# Patient Record
Sex: Male | Born: 1980
Health system: Southern US, Community
[De-identification: ages and names within clinical notes are randomized; demographics above are authoritative.]

## PROBLEM LIST (undated history)

## (undated) DIAGNOSIS — K56609 Unspecified intestinal obstruction, unspecified as to partial versus complete obstruction: Secondary | ICD-10-CM

## (undated) HISTORY — PX: SMALL INTESTINE SURGERY: SHX150

---

## 2001-07-18 DIAGNOSIS — K56609 Unspecified intestinal obstruction, unspecified as to partial versus complete obstruction: Secondary | ICD-10-CM

## 2001-07-18 HISTORY — DX: Unspecified intestinal obstruction, unspecified as to partial versus complete obstruction: K56.609

## 2001-12-07 ENCOUNTER — Inpatient Hospital Stay (HOSPITAL_COMMUNITY): Admission: EM | Admit: 2001-12-07 | Discharge: 2001-12-07 | Payer: Self-pay | Admitting: *Deleted

## 2001-12-07 ENCOUNTER — Encounter: Payer: Self-pay | Admitting: Emergency Medicine

## 2009-04-02 ENCOUNTER — Encounter: Admission: RE | Admit: 2009-04-02 | Discharge: 2009-04-02 | Payer: Self-pay | Admitting: Internal Medicine

## 2010-12-03 NOTE — H&P (Signed)
[facility]  Patient:    HASHIM, EICHHORST Visit Number: [id number] MRN: [id number]          Service Type: MED Location: [phone] Attending Physician:  Arlis Porta Dictated by:   Adolph Pollack, M.D. Admit Date:  12/06/2001 Discharge Date: 12/07/2001                           History and Physical  CHIEF COMPLAINT:  Acute onset of periumbilical abdominal pain.  HISTORY OF PRESENT ILLNESS:  Mr. Tedesco is a 30 year old male in his normal state of health.  He came home from work yesterday afternoon and began having the acute onset of sharp periumbilical pain that radiated basically to the left lower quadrant region.  He subsequently tried to eat today, but each time he did it was associated with nausea and vomiting.  No fever or chills.  He had a bowel movement yesterday.  He states that he has had no change in his bowel habits recently.  No dysuria or hematuria.  When I saw him, he had attempted to take oral contrast for a CT scan and he had been vomiting.  He had been evaluated by the emergency department physician prior to this.  He states that he has not had anything else like this.  Because of the progressive and persistent pain, he presented to the emergency department for evaluation at approximately 9 p.m. on Dec 06, 2001.  PAST MEDICAL HISTORY:  Chronic hyperbilirubinemia.  PAST SURGICAL HISTORY:  None.  ALLERGIES:  None.  MEDICATIONS:  None.  SOCIAL HISTORY:  He denies tobacco or alcohol use.  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  [place] with his parents typically.  FAMILY HISTORY:  His mother has hypertension.  His has hyperbilirubinemia, etiology unknown.  REVIEW OF SYSTEMS:  Cardiovascular:  No heart disease or hypertension. Pulmonary:  No asthma, pneumonia, or TB.  GI:  No peptic ulcer disease, hepatitis, or  diverticulitis.  No melena or hematochezia.  GU:  No kidney stones.  Endocrine:  No diabetes or thyroid disease.  Neurologic:  No seizures.  Hematology:  No known bleeding disorders, blood clots, and transfusions.  PHYSICAL EXAMINATION:  Generally a well-developed, well-nourished male who does not appear to be in acute distress.  VITAL SIGNS:  His admission temperature was 98.2 degrees with a blood pressure of 96/60 and pulse of 114.  When he is placed in the sitting position, his blood pressure dropped to 70/36 with a pulse of 124 and a respiratory rate of 20.  SKIN:  Warm and dry.  No jaundice.  HEENT:  The eyes are icteric.  NECK:  Supple without masses.  CARDIOVASCULAR:  Increased rate with a regular rhythm.  RESPIRATORY:  Breath sounds equal and clear.  Respirations unlabored.  ABDOMEN:  Soft.  There is a left lower quadrant fullness that is tender to palpation.  Mild percussion tenderness in the left lower quadrant greater than the right lower quadrant, but no obvious peritoneal signs.  He has hypoactive bowel sounds.  No hernias noted.  RECTAL:  Exam was done by Smitty Cords. Cheek, M.D., but notable for some mild prostate tenderness and guaiac-negative stool.  EXTREMITIES:  Full range of motion.  No clubbing, cyanosis, or edema noted.  LABORATORY DATA:  Hemoglobin 17.5 with a white blood  cell count of 11,400 and a leftward shift.  His CMET is normal, except for a total bilirubin of 4.2. Lipase within normal limits.  The urinalysis is remarkable for elevation of ketones, 0-2 white blood cells, and a few bacteria.  A CT scan of the abdomen and pelvis was performed and read by Cristi Loron, M.D.  This demonstrated a dilated diverticular-like structure with inflammatory changes and a small amount of free fluid around it.  There is small bowel dilated proximal to this area with no free air.  Cristi Loron, M.D., felt that this was consistent with acute  Meckels diverticulitis.  IMPRESSION: 1. Abdominal pain with findings suggesting acute Meckels diverticulitis with    partial small bowel obstruction or ileus.  Currently no evidence of    peritonitis at this time. 2. Hypovolemia with orthostatic hypotension. 3. Chronic hyperbilirubinemia, etiology unknown.  PLAN:  Volume resuscitation and IV antibiotics.  I recommended that he undergo laparoscopy or laparotomy with possible bowel resection later today after volume resuscitation.  His mother would like to see if he can be transferred to the [place], [place] where they live as if he has a prolonged hospital stay it would be easier on them.  She also says that they have a surgeon that they have used before.  I told her that she would have to have a local physician and a local facility accept him in transfer and recommended that she call her local physician if this is what she wanted to do and I could arrange for a transfer.  I told her that I thought that this was something that needed to be taken care of relatively soon and should not wait for a day or two.  She seems to understand this. Dictated by:   Adolph Pollack, M.D. Attending Physician:  Arlis Porta DD:  12/07/01 TD:  12/09/01 Job: [id number]

## 2014-01-02 ENCOUNTER — Ambulatory Visit
Admission: RE | Admit: 2014-01-02 | Discharge: 2014-01-02 | Disposition: A | Discharge status: Home / Self Care | Payer: BC Managed Care – PPO | Source: Ambulatory Visit | Attending: Family | Admitting: Family

## 2014-01-02 ENCOUNTER — Other Ambulatory Visit: Payer: Self-pay | Admitting: Family

## 2014-01-02 DIAGNOSIS — R062 Wheezing: Secondary | ICD-10-CM

## 2014-01-02 DIAGNOSIS — J988 Other specified respiratory disorders: Secondary | ICD-10-CM

## 2015-12-29 DIAGNOSIS — H5213 Myopia, bilateral: Secondary | ICD-10-CM | POA: Diagnosis not present

## 2016-10-17 DIAGNOSIS — H5213 Myopia, bilateral: Secondary | ICD-10-CM | POA: Diagnosis not present

## 2017-07-28 DIAGNOSIS — J069 Acute upper respiratory infection, unspecified: Secondary | ICD-10-CM | POA: Diagnosis not present

## 2017-11-10 DIAGNOSIS — H5213 Myopia, bilateral: Secondary | ICD-10-CM | POA: Diagnosis not present

## 2018-01-09 ENCOUNTER — Emergency Department (HOSPITAL_COMMUNITY): Payer: BLUE CROSS/BLUE SHIELD

## 2018-01-09 ENCOUNTER — Observation Stay (HOSPITAL_COMMUNITY): Payer: BLUE CROSS/BLUE SHIELD | Admitting: Anesthesiology

## 2018-01-09 ENCOUNTER — Observation Stay (HOSPITAL_COMMUNITY)
Admission: EM | Admit: 2018-01-09 | Discharge: 2018-01-10 | Disposition: A | Discharge status: Home / Self Care | Payer: BLUE CROSS/BLUE SHIELD | Attending: Surgery | Admitting: Surgery

## 2018-01-09 ENCOUNTER — Other Ambulatory Visit: Payer: Self-pay

## 2018-01-09 ENCOUNTER — Encounter (HOSPITAL_COMMUNITY): Payer: Self-pay | Admitting: Emergency Medicine

## 2018-01-09 ENCOUNTER — Observation Stay (HOSPITAL_COMMUNITY): Payer: BLUE CROSS/BLUE SHIELD

## 2018-01-09 ENCOUNTER — Encounter (HOSPITAL_COMMUNITY)
Admission: EM | Disposition: A | Discharge status: Home / Self Care | Payer: Self-pay | Source: Home / Self Care | Attending: Emergency Medicine

## 2018-01-09 DIAGNOSIS — R079 Chest pain, unspecified: Secondary | ICD-10-CM | POA: Diagnosis not present

## 2018-01-09 DIAGNOSIS — K839 Disease of biliary tract, unspecified: Secondary | ICD-10-CM | POA: Diagnosis not present

## 2018-01-09 DIAGNOSIS — K8020 Calculus of gallbladder without cholecystitis without obstruction: Secondary | ICD-10-CM | POA: Diagnosis not present

## 2018-01-09 DIAGNOSIS — K8012 Calculus of gallbladder with acute and chronic cholecystitis without obstruction: Secondary | ICD-10-CM | POA: Diagnosis not present

## 2018-01-09 DIAGNOSIS — K8000 Calculus of gallbladder with acute cholecystitis without obstruction: Secondary | ICD-10-CM | POA: Diagnosis present

## 2018-01-09 DIAGNOSIS — Z6841 Body Mass Index (BMI) 40.0 and over, adult: Secondary | ICD-10-CM | POA: Insufficient documentation

## 2018-01-09 DIAGNOSIS — K8010 Calculus of gallbladder with chronic cholecystitis without obstruction: Secondary | ICD-10-CM | POA: Diagnosis not present

## 2018-01-09 DIAGNOSIS — K8050 Calculus of bile duct without cholangitis or cholecystitis without obstruction: Secondary | ICD-10-CM | POA: Diagnosis not present

## 2018-01-09 DIAGNOSIS — K828 Other specified diseases of gallbladder: Secondary | ICD-10-CM | POA: Diagnosis not present

## 2018-01-09 DIAGNOSIS — R109 Unspecified abdominal pain: Secondary | ICD-10-CM | POA: Diagnosis not present

## 2018-01-09 DIAGNOSIS — Z419 Encounter for procedure for purposes other than remedying health state, unspecified: Secondary | ICD-10-CM

## 2018-01-09 HISTORY — PX: CHOLECYSTECTOMY: SHX55

## 2018-01-09 HISTORY — DX: Unspecified intestinal obstruction, unspecified as to partial versus complete obstruction: K56.609

## 2018-01-09 LAB — URINALYSIS, ROUTINE W REFLEX MICROSCOPIC
Bilirubin Urine: NEGATIVE
Glucose, UA: NEGATIVE mg/dL
Hgb urine dipstick: NEGATIVE
KETONES UR: NEGATIVE mg/dL
LEUKOCYTES UA: NEGATIVE
NITRITE: NEGATIVE
PH: 6 (ref 5.0–8.0)
PROTEIN: NEGATIVE mg/dL
SPECIFIC GRAVITY, URINE: 1.024 (ref 1.005–1.030)

## 2018-01-09 LAB — COMPREHENSIVE METABOLIC PANEL
ALBUMIN: 4.5 g/dL (ref 3.5–5.0)
ALK PHOS: 72 U/L (ref 38–126)
ALT: 70 U/L — ABNORMAL HIGH (ref 0–44)
ANION GAP: 9 (ref 5–15)
AST: 32 U/L (ref 15–41)
BUN: 17 mg/dL (ref 6–20)
CALCIUM: 10 mg/dL (ref 8.9–10.3)
CO2: 27 mmol/L (ref 22–32)
Chloride: 105 mmol/L (ref 98–111)
Creatinine, Ser: 1.03 mg/dL (ref 0.61–1.24)
GFR calc Af Amer: >60 mL/min (ref >60)
GLUCOSE: 119 mg/dL — AB (ref 70–99)
Potassium: 3.9 mmol/L (ref 3.5–5.1)
Sodium: 141 mmol/L (ref 135–145)
Total Bilirubin: 2.2 mg/dL — ABNORMAL HIGH (ref 0.3–1.2)
Total Protein: 7.8 g/dL (ref 6.5–8.1)

## 2018-01-09 LAB — CBC WITH DIFFERENTIAL/PLATELET
Basophils Absolute: 0 10*3/uL (ref 0.0–0.1)
Basophils Relative: 0 %
EOS PCT: 0 %
Eosinophils Absolute: 0 10*3/uL (ref 0.0–0.7)
HCT: 46.6 % (ref 39.0–52.0)
Hemoglobin: 16.3 g/dL (ref 13.0–17.0)
LYMPHS PCT: 10 %
Lymphs Abs: 1.2 10*3/uL (ref 0.7–4.0)
MCH: 30.9 pg (ref 26.0–34.0)
MCHC: 35 g/dL (ref 30.0–36.0)
MCV: 88.3 fL (ref 78.0–100.0)
MONO ABS: 0.5 10*3/uL (ref 0.1–1.0)
Monocytes Relative: 4 %
Neutro Abs: 10 10*3/uL — ABNORMAL HIGH (ref 1.7–7.7)
Neutrophils Relative %: 86 %
PLATELETS: 252 10*3/uL (ref 150–400)
RBC: 5.28 MIL/uL (ref 4.22–5.81)
RDW: 12.4 % (ref 11.5–15.5)
WBC: 11.7 10*3/uL — AB (ref 4.0–10.5)

## 2018-01-09 LAB — I-STAT TROPONIN, ED
TROPONIN I, POC: 0.02 ng/mL (ref 0.00–0.08)
Troponin i, poc: 0.01 ng/mL (ref 0.00–0.08)

## 2018-01-09 LAB — LIPASE, BLOOD: Lipase: 29 U/L (ref 11–51)

## 2018-01-09 SURGERY — LAPAROSCOPIC CHOLECYSTECTOMY WITH INTRAOPERATIVE CHOLANGIOGRAM
Anesthesia: General | Site: Abdomen

## 2018-01-09 MED ORDER — SODIUM CHLORIDE 0.9 % IV SOLN
2.0000 g | Freq: Once | INTRAVENOUS | Status: AC
  Administered 2018-01-09: 2 g via INTRAVENOUS

## 2018-01-09 MED ORDER — FENTANYL CITRATE (PF) 250 MCG/5ML IJ SOLN
INTRAMUSCULAR | Status: DC | PRN
  Administered 2018-01-09: 50 ug via INTRAVENOUS
  Administered 2018-01-09: 150 ug via INTRAVENOUS

## 2018-01-09 MED ORDER — OXYCODONE HCL 5 MG PO TABS
5.0000 mg | ORAL_TABLET | ORAL | Status: DC | PRN
  Administered 2018-01-09: 5 mg via ORAL
  Administered 2018-01-10 (×2): 10 mg via ORAL
  Filled 2018-01-09: qty 2
  Filled 2018-01-09: qty 1
  Filled 2018-01-09: qty 2

## 2018-01-09 MED ORDER — PROPOFOL 10 MG/ML IV BOLUS
INTRAVENOUS | Status: DC | PRN
  Administered 2018-01-09: 200 mg via INTRAVENOUS
  Administered 2018-01-09 (×2): 50 mg via INTRAVENOUS

## 2018-01-09 MED ORDER — ONDANSETRON HCL 4 MG/2ML IJ SOLN: 4.0000 mg | Freq: Four times a day (QID) | INTRAMUSCULAR | Status: DC | PRN

## 2018-01-09 MED ORDER — OXYCODONE HCL 5 MG/5ML PO SOLN: 5.0000 mg | Freq: Once | ORAL | Status: DC | PRN

## 2018-01-09 MED ORDER — FENTANYL CITRATE (PF) 250 MCG/5ML IJ SOLN
INTRAMUSCULAR | Status: AC
  Filled 2018-01-09: qty 5

## 2018-01-09 MED ORDER — MIDAZOLAM HCL 2 MG/2ML IJ SOLN
INTRAMUSCULAR | Status: AC
  Filled 2018-01-09: qty 2

## 2018-01-09 MED ORDER — IOPAMIDOL (ISOVUE-300) INJECTION 61%
INTRAVENOUS | Status: AC
  Filled 2018-01-09: qty 50

## 2018-01-09 MED ORDER — ONDANSETRON HCL 4 MG/2ML IJ SOLN
INTRAMUSCULAR | Status: AC
  Filled 2018-01-09: qty 2

## 2018-01-09 MED ORDER — OXYCODONE HCL 5 MG PO TABS: 5.0000 mg | ORAL_TABLET | Freq: Once | ORAL | Status: DC | PRN

## 2018-01-09 MED ORDER — CEFAZOLIN SODIUM-DEXTROSE 2-4 GM/100ML-% IV SOLN
INTRAVENOUS | Status: AC
  Filled 2018-01-09: qty 100

## 2018-01-09 MED ORDER — SUGAMMADEX SODIUM 200 MG/2ML IV SOLN
INTRAVENOUS | Status: DC | PRN
  Administered 2018-01-09: 300 mg via INTRAVENOUS

## 2018-01-09 MED ORDER — SODIUM CHLORIDE 0.9 % IV SOLN
2.0000 g | INTRAVENOUS | Status: DC
  Filled 2018-01-09: qty 20

## 2018-01-09 MED ORDER — ONDANSETRON 4 MG PO TBDP: 4.0000 mg | ORAL_TABLET | Freq: Four times a day (QID) | ORAL | Status: DC | PRN

## 2018-01-09 MED ORDER — LACTATED RINGERS IV SOLN
INTRAVENOUS | Status: DC
  Administered 2018-01-09: 14:00:00 via INTRAVENOUS

## 2018-01-09 MED ORDER — LIDOCAINE 2% (20 MG/ML) 5 ML SYRINGE
INTRAMUSCULAR | Status: DC | PRN
  Administered 2018-01-09: 100 mg via INTRAVENOUS

## 2018-01-09 MED ORDER — LIDOCAINE 2% (20 MG/ML) 5 ML SYRINGE
INTRAMUSCULAR | Status: AC
  Filled 2018-01-09: qty 10

## 2018-01-09 MED ORDER — MIDAZOLAM HCL 2 MG/2ML IJ SOLN
INTRAMUSCULAR | Status: DC | PRN
  Administered 2018-01-09: 2 mg via INTRAVENOUS

## 2018-01-09 MED ORDER — IOPAMIDOL (ISOVUE-300) INJECTION 61%
INTRAVENOUS | Status: AC
  Filled 2018-01-09: qty 100

## 2018-01-09 MED ORDER — DOCUSATE SODIUM 100 MG PO CAPS: 100.0000 mg | ORAL_CAPSULE | Freq: Two times a day (BID) | ORAL | 0 refills | Status: AC

## 2018-01-09 MED ORDER — PROPOFOL 10 MG/ML IV BOLUS
INTRAVENOUS | Status: AC
  Filled 2018-01-09: qty 20

## 2018-01-09 MED ORDER — SUGAMMADEX SODIUM 500 MG/5ML IV SOLN
INTRAVENOUS | Status: AC
  Filled 2018-01-09: qty 5

## 2018-01-09 MED ORDER — LACTATED RINGERS IR SOLN
Status: DC | PRN
Start: 2018-01-09 — End: 2018-01-09
  Administered 2018-01-09: 1000 mL

## 2018-01-09 MED ORDER — ONDANSETRON HCL 4 MG/2ML IJ SOLN
4.0000 mg | INTRAMUSCULAR | Status: DC | PRN
  Administered 2018-01-09: 4 mg via INTRAVENOUS
  Filled 2018-01-09: qty 2

## 2018-01-09 MED ORDER — KETOROLAC TROMETHAMINE 30 MG/ML IJ SOLN: 30.0000 mg | Freq: Once | INTRAMUSCULAR | Status: DC | PRN

## 2018-01-09 MED ORDER — TRAMADOL HCL 50 MG PO TABS
50.0000 mg | ORAL_TABLET | Freq: Four times a day (QID) | ORAL | Status: DC | PRN
  Administered 2018-01-09: 50 mg via ORAL
  Filled 2018-01-09: qty 1

## 2018-01-09 MED ORDER — SODIUM CHLORIDE 0.9 % IV SOLN: INTRAVENOUS | Status: DC

## 2018-01-09 MED ORDER — DIPHENHYDRAMINE HCL 50 MG/ML IJ SOLN: 25.0000 mg | Freq: Four times a day (QID) | INTRAMUSCULAR | Status: DC | PRN

## 2018-01-09 MED ORDER — IOPAMIDOL (ISOVUE-300) INJECTION 61%
100.0000 mL | Freq: Once | INTRAVENOUS | Status: AC | PRN
  Administered 2018-01-09: 100 mL via INTRAVENOUS

## 2018-01-09 MED ORDER — GI COCKTAIL ~~LOC~~
30.0000 mL | Freq: Once | ORAL | Status: AC
  Administered 2018-01-09: 30 mL via ORAL
  Filled 2018-01-09: qty 30

## 2018-01-09 MED ORDER — SUCCINYLCHOLINE CHLORIDE 200 MG/10ML IV SOSY
PREFILLED_SYRINGE | INTRAVENOUS | Status: AC
  Filled 2018-01-09: qty 10

## 2018-01-09 MED ORDER — SODIUM CHLORIDE 0.9 % IV SOLN
INTRAVENOUS | Status: DC
  Administered 2018-01-09 – 2018-01-10 (×2): via INTRAVENOUS

## 2018-01-09 MED ORDER — PROMETHAZINE HCL 25 MG/ML IJ SOLN: 6.2500 mg | INTRAMUSCULAR | Status: DC | PRN

## 2018-01-09 MED ORDER — HEPARIN SODIUM (PORCINE) 5000 UNIT/ML IJ SOLN: 5000.0000 [IU] | Freq: Three times a day (TID) | INTRAMUSCULAR | Status: DC

## 2018-01-09 MED ORDER — HYDROMORPHONE HCL 1 MG/ML IJ SOLN
INTRAMUSCULAR | Status: AC
  Filled 2018-01-09: qty 1

## 2018-01-09 MED ORDER — MORPHINE SULFATE (PF) 2 MG/ML IV SOLN: 2.0000 mg | INTRAVENOUS | Status: DC | PRN

## 2018-01-09 MED ORDER — BUPIVACAINE-EPINEPHRINE 0.25% -1:200000 IJ SOLN
INTRAMUSCULAR | Status: DC | PRN
  Administered 2018-01-09: 26 mL

## 2018-01-09 MED ORDER — DEXAMETHASONE SODIUM PHOSPHATE 10 MG/ML IJ SOLN
INTRAMUSCULAR | Status: DC | PRN
  Administered 2018-01-09: 10 mg via INTRAVENOUS

## 2018-01-09 MED ORDER — DEXAMETHASONE SODIUM PHOSPHATE 10 MG/ML IJ SOLN
INTRAMUSCULAR | Status: AC
Start: 2018-01-09 — End: ?
  Filled 2018-01-09: qty 1

## 2018-01-09 MED ORDER — HYDROCODONE-ACETAMINOPHEN 5-325 MG PO TABS: 1.0000 | ORAL_TABLET | Freq: Four times a day (QID) | ORAL | 0 refills | Status: AC | PRN

## 2018-01-09 MED ORDER — 0.9 % SODIUM CHLORIDE (POUR BTL) OPTIME
TOPICAL | Status: DC | PRN
  Administered 2018-01-09: 1000 mL

## 2018-01-09 MED ORDER — IOPAMIDOL (ISOVUE-300) INJECTION 61%
INTRAVENOUS | Status: DC | PRN
  Administered 2018-01-09: 15 mL

## 2018-01-09 MED ORDER — BUPIVACAINE-EPINEPHRINE (PF) 0.25% -1:200000 IJ SOLN
INTRAMUSCULAR | Status: AC
  Filled 2018-01-09: qty 30

## 2018-01-09 MED ORDER — FAMOTIDINE 20 MG PO TABS
20.0000 mg | ORAL_TABLET | Freq: Every day | ORAL | Status: DC
  Administered 2018-01-09: 20 mg via ORAL
  Filled 2018-01-09: qty 1

## 2018-01-09 MED ORDER — FAMOTIDINE IN NACL 20-0.9 MG/50ML-% IV SOLN
20.0000 mg | Freq: Once | INTRAVENOUS | Status: AC
  Administered 2018-01-09: 20 mg via INTRAVENOUS
  Filled 2018-01-09: qty 50

## 2018-01-09 MED ORDER — ACETAMINOPHEN 500 MG PO TABS
1000.0000 mg | ORAL_TABLET | Freq: Four times a day (QID) | ORAL | Status: DC
  Administered 2018-01-09 – 2018-01-10 (×4): 1000 mg via ORAL
  Filled 2018-01-09 (×4): qty 2

## 2018-01-09 MED ORDER — ONDANSETRON HCL 4 MG/2ML IJ SOLN
INTRAMUSCULAR | Status: DC | PRN
  Administered 2018-01-09: 4 mg via INTRAVENOUS

## 2018-01-09 MED ORDER — DIPHENHYDRAMINE HCL 25 MG PO CAPS: 25.0000 mg | ORAL_CAPSULE | Freq: Four times a day (QID) | ORAL | Status: DC | PRN

## 2018-01-09 MED ORDER — HYDROMORPHONE HCL 1 MG/ML IJ SOLN
0.2500 mg | INTRAMUSCULAR | Status: DC | PRN
  Administered 2018-01-09: 0.5 mg via INTRAVENOUS

## 2018-01-09 MED ORDER — ROCURONIUM BROMIDE 10 MG/ML (PF) SYRINGE
PREFILLED_SYRINGE | INTRAVENOUS | Status: DC | PRN
  Administered 2018-01-09: 10 mg via INTRAVENOUS
  Administered 2018-01-09: 70 mg via INTRAVENOUS

## 2018-01-09 MED ORDER — MORPHINE SULFATE (PF) 4 MG/ML IV SOLN
4.0000 mg | INTRAVENOUS | Status: DC | PRN
  Administered 2018-01-09: 4 mg via INTRAVENOUS
  Filled 2018-01-09: qty 1

## 2018-01-09 SURGICAL SUPPLY — 38 items
ADH SKN CLS APL DERMABOND .7 (GAUZE/BANDAGES/DRESSINGS) ×1
APL SKNCLS STERI-STRIP NONHPOA (GAUZE/BANDAGES/DRESSINGS) ×1
APPLIER CLIP ROT 10 11.4 M/L (STAPLE) ×2
APR CLP MED LRG 11.4X10 (STAPLE) ×1
BAG SPEC RTRVL LRG 6X4 10 (ENDOMECHANICALS) ×1
BENZOIN TINCTURE PRP APPL 2/3 (GAUZE/BANDAGES/DRESSINGS) ×1 IMPLANT
CABLE HIGH FREQUENCY MONO STRZ (ELECTRODE) ×2 IMPLANT
CHLORAPREP W/TINT 26ML (MISCELLANEOUS) ×2 IMPLANT
CLIP APPLIE ROT 10 11.4 M/L (STAPLE) ×1 IMPLANT
COVER MAYO STAND STRL (DRAPES) ×1 IMPLANT
COVER SURGICAL LIGHT HANDLE (MISCELLANEOUS) ×2 IMPLANT
DECANTER SPIKE VIAL GLASS SM (MISCELLANEOUS) ×2 IMPLANT
DERMABOND ADVANCED (GAUZE/BANDAGES/DRESSINGS) ×1
DERMABOND ADVANCED .7 DNX12 (GAUZE/BANDAGES/DRESSINGS) ×1 IMPLANT
DRAPE C-ARM 42X120 X-RAY (DRAPES) ×1 IMPLANT
ELECT REM PT RETURN 15FT ADLT (MISCELLANEOUS) ×2 IMPLANT
GLOVE BIO SURGEON STRL SZ 6 (GLOVE) ×2 IMPLANT
GLOVE INDICATOR 6.5 STRL GRN (GLOVE) ×2 IMPLANT
GOWN STRL REUS W/TWL LRG LVL3 (GOWN DISPOSABLE) ×2 IMPLANT
GOWN STRL REUS W/TWL XL LVL3 (GOWN DISPOSABLE) ×4 IMPLANT
GRASPER SUT TROCAR 14GX15 (MISCELLANEOUS) IMPLANT
HEMOSTAT SNOW SURGICEL 2X4 (HEMOSTASIS) IMPLANT
KIT BASIN OR (CUSTOM PROCEDURE TRAY) ×2 IMPLANT
NDL INSUFFLATION 14GA 120MM (NEEDLE) ×1 IMPLANT
NEEDLE INSUFFLATION 14GA 120MM (NEEDLE) ×2 IMPLANT
POUCH SPECIMEN RETRIEVAL 10MM (ENDOMECHANICALS) ×2 IMPLANT
SCISSORS LAP 5X35 DISP (ENDOMECHANICALS) ×2 IMPLANT
SET CHOLANGIOGRAPH MIX (MISCELLANEOUS) ×1 IMPLANT
SET IRRIG TUBING LAPAROSCOPIC (IRRIGATION / IRRIGATOR) ×2 IMPLANT
SLEEVE XCEL OPT CAN 5 100 (ENDOMECHANICALS) ×4 IMPLANT
STRIP CLOSURE SKIN 1/2X4 (GAUZE/BANDAGES/DRESSINGS) ×1 IMPLANT
SUT MNCRL AB 4-0 PS2 18 (SUTURE) ×2 IMPLANT
TOWEL OR 17X26 10 PK STRL BLUE (TOWEL DISPOSABLE) ×2 IMPLANT
TOWEL OR NON WOVEN STRL DISP B (DISPOSABLE) IMPLANT
TRAY LAPAROSCOPIC (CUSTOM PROCEDURE TRAY) ×2 IMPLANT
TROCAR BLADELESS OPT 5 100 (ENDOMECHANICALS) ×2 IMPLANT
TROCAR XCEL 12X100 BLDLESS (ENDOMECHANICALS) ×2 IMPLANT
TUBING INSUF HEATED (TUBING) ×2 IMPLANT

## 2018-01-09 NOTE — H&P (Addendum)
Steven Webb is an 37 y.o. male.   Chief Complaint: Abdominal pain PCP: [facility] [place]  HPI: Patient is a 37 year old male who started abdominal pain last evening went up to his chest.  He took Gas-X and Tums without relief.  He presented to the ED this a.m. with assistant constant upper abdominal pain described as sharp with radiation to his chest.  Complains of pain in his right side and could not touch it due to increased abdominal pain.  No nausea vomiting diarrhea no fevers no back pain no rashes no chest pain or shortness of breath.  Work-up in the ED shows his blood pressure is elevated at 174/103.  It remains high the last one at 10 AM was 155/100.  Respiratory rate and pulses are normal.  Sats are 92% on room air.  His BMI is 40.89. Labs shows electrolytes are normal, his glucose is 119.  His lipase is 32.  His bilirubin is elevated at 2.2.  AST is 32, ALT is 70.  White count is 11.7, hemoglobin 16.3, hematocrit 46.6, platelets are 252,000.  His left shift with 86 neutrophils.  Urinalysis is negative. Chest x-ray is normal there is mild cardiomegaly with no acute active cardiopulmonary disease.  CT of the abdomen shows steatosis of the liver possible stones in the gallbladder neck.  The gallbladder somewhat distended there is a clinical concern regarding cholecystitis.  An abdominal ultrasound was obtained at that time. This shows mild sludge and tiny gallstones noted.  Gallbladder wall thickness is 2.5 mm.  The bladder was distended.  Common bile duct was 5.7 mm.  Liver showed increased hepatic echogenicity consistent with fatty liver infiltration or hepatocellular disease.  Portal vein was patent with normal blood flow.  We are asked to see.   Past Medical History:  Diagnosis Date  . SBO (small bowel obstruction) ([place]) 2003    History reviewed. No pertinent surgical history.  No family history on file. Social History:  has no tobacco, alcohol, and drug  history on file. History of Meckel's diverticulum with resection 2002 Alcohol social Drugs: None Tobacco: None He is a Forensic psychologist.,  Married Allergies: No Known Allergies  Prior to Admission medications   Medication Sig Start Date End Date Taking? Authorizing Provider  Multiple Vitamins-Minerals (MULTIVITAMIN ADULT PO) Take 1 tablet by mouth daily.   Yes [provider]      Results for orders placed or performed during the hospital encounter of 01/09/18 (from the past 48 hour(s))  CBC with Differential     Status: Abnormal   Collection Time: 01/09/18  8:28 AM  Result Value Ref Range   WBC 11.7 (H) 4.0 - 10.5 K/uL   RBC 5.28 4.22 - 5.81 MIL/uL   Hemoglobin 16.3 13.0 - 17.0 g/dL   HCT 46.6 39.0 - 52.0 %   MCV 88.3 78.0 - 100.0 fL   MCH 30.9 26.0 - 34.0 pg   MCHC 35.0 30.0 - 36.0 g/dL   RDW 12.4 11.5 - 15.5 %   Platelets 252 150 - 400 K/uL   Neutrophils Relative % 86 %   Neutro Abs 10.0 (H) 1.7 - 7.7 K/uL   Lymphocytes Relative 10 %   Lymphs Abs 1.2 0.7 - 4.0 K/uL   Monocytes Relative 4 %   Monocytes Absolute 0.5 0.1 - 1.0 K/uL   Eosinophils Relative 0 %   Eosinophils Absolute 0.0 0.0 - 0.7 K/uL   Basophils Relative 0 %   Basophils Absolute  0.0 0.0 - 0.1 K/uL    Comment: Performed at [facility], [place] [address] [place], [place] [address]  Comprehensive metabolic panel     Status: Abnormal   Collection Time: 01/09/18  8:28 AM  Result Value Ref Range   Sodium 141 135 - 145 mmol/L   Potassium 3.9 3.5 - 5.1 mmol/L   Chloride 105 98 - 111 mmol/L    Comment: Please note change in reference range.   CO2 27 22 - 32 mmol/L   Glucose, Bld 119 (H) 70 - 99 mg/dL    Comment: Please note change in reference range.   BUN 17 6 - 20 mg/dL    Comment: Please note change in reference range.   Creatinine, Ser 1.03 0.61 - 1.24 mg/dL   Calcium 10.0 8.9 - 10.3 mg/dL   Total Protein 7.8 6.5 - 8.1 g/dL   Albumin 4.5 3.5 - 5.0 g/dL   AST 32 15 - 41 U/L    ALT 70 (H) 0 - 44 U/L    Comment: Please note change in reference range.   Alkaline Phosphatase 72 38 - 126 U/L   Total Bilirubin 2.2 (H) 0.3 - 1.2 mg/dL   GFR calc non Af Amer >60 >60 mL/min   GFR calc Af Amer >60 >60 mL/min    Comment: (NOTE) The eGFR has been calculated using the CKD EPI equation. This calculation has not been validated in all clinical situations. eGFR's persistently <60 mL/min signify possible Chronic Kidney Disease.    Anion gap 9 5 - 15    Comment: Performed at [facility], [place] [address] [place], [place] [address]  Lipase, blood     Status: None   Collection Time: 01/09/18  8:28 AM  Result Value Ref Range   Lipase 29 11 - 51 U/L    Comment: Performed at [facility], [place] [address] [place], [place] [address]  Urinalysis, Routine w reflex microscopic     Status: None   Collection Time: 01/09/18  8:28 AM  Result Value Ref Range   Color, Urine YELLOW YELLOW   APPearance CLEAR CLEAR   Specific Gravity, Urine 1.024 1.005 - 1.030   pH 6.0 5.0 - 8.0   Glucose, UA NEGATIVE NEGATIVE mg/dL   Hgb urine dipstick NEGATIVE NEGATIVE   Bilirubin Urine NEGATIVE NEGATIVE   Ketones, ur NEGATIVE NEGATIVE mg/dL   Protein, ur NEGATIVE NEGATIVE mg/dL   Nitrite NEGATIVE NEGATIVE   Leukocytes, UA NEGATIVE NEGATIVE    Comment: Performed at [facility] [address] [place], [place] [address]  I-Stat Troponin, ED (not at [facility])     Status: None   Collection Time: 01/09/18  8:47 AM  Result Value Ref Range   Troponin i, poc 0.01 0.00 - 0.08 ng/mL   Comment 3            Comment: Due to the release kinetics of cTnI, a negative result within the first hours of the onset of symptoms does not rule out myocardial infarction with certainty. If myocardial infarction is still suspected, repeat the test at appropriate intervals.   I-stat troponin, ED     Status: None   Collection Time: 01/09/18 10:57 AM  Result Value  Ref Range   Troponin i, poc 0.02 0.00 - 0.08 ng/mL   Comment 3            Comment: Due to the release kinetics of cTnI, a negative result within the first  hours of the onset of symptoms does not rule out myocardial infarction with certainty. If myocardial infarction is still suspected, repeat the test at appropriate intervals.    Dg Chest 2 View  Result Date: 01/09/2018 CLINICAL DATA:  New onset mid chest pain. EXAM: CHEST - 2 VIEW COMPARISON:  Chest x-ray dated January 02, 2014. FINDINGS: Mild cardiomegaly, unchanged. Normal pulmonary vascularity. No focal consolidation, pleural effusion, or pneumothorax. No acute osseous abnormality. IMPRESSION: No active cardiopulmonary disease. Electronically Signed   By: Titus Dubin M.D.   On: 01/09/2018 07:59   Ct Abdomen Pelvis W Contrast  Result Date: 01/09/2018 CLINICAL DATA:  Abdominal pain beginning last night, progressing to chest pain. History of colon resection. EXAM: CT ABDOMEN AND PELVIS WITH CONTRAST TECHNIQUE: Multidetector CT imaging of the abdomen and pelvis was performed using the standard protocol following bolus administration of intravenous contrast. CONTRAST:  150m ISOVUE-300 IOPAMIDOL (ISOVUE-300) INJECTION 61% COMPARISON:  None. FINDINGS: Lower chest: No significant finding. Hepatobiliary: Diffuse fatty change of the liver without focal lesion. I suspect that there are some pigment stones in the gallbladder neck region. The gallbladder is distended. If there is clinical concern about the possibility of cholecystitis, ultrasound would be suggested. Pancreas: Normal Spleen: Normal Adrenals/Urinary Tract: Adrenal glands are normal. Kidneys are normal. Bladder is normal. Stomach/Bowel: No evidence of bowel obstruction or inflammation. Vascular/Lymphatic: Normal Reproductive: Normal Other: No free fluid or air. Musculoskeletal: Negative IMPRESSION: Steatosis of the liver. Suspicion of pigment stones in the gallbladder neck. Gallbladder  somewhat distended. Is there clinical concern regarding cholecystitis? If so, ultrasound would be suggested. Electronically Signed   By: MNelson ChimesM.D.   On: 01/09/2018 10:08   UKoreaAbdomen Limited  Result Date: 01/09/2018 CLINICAL DATA:  Questionable gallstones. EXAM: ULTRASOUND ABDOMEN LIMITED RIGHT UPPER QUADRANT COMPARISON:  CT 01/09/2018. FINDINGS: Gallbladder: Mild sludge and tiny gallstones noted. Gallbladder wall thickness 2.5 mm. Gallbladder is distended. Negative Murphy sign. Common bile duct: Diameter: 5.7 mm. Liver: Increased hepatic echogenicity consistent fatty infiltration and or hepatocellular disease. Focal area of decreased echogenicity adjacent to the gallbladder fossa consistent with focal fatty sparing. Portal vein is patent on color Doppler imaging with normal direction of blood flow towards the liver. IMPRESSION: 1. Mild sludge with tiny gallstones noted. Gallbladder is distended. Negative Murphy sign. No biliary distention. 2. Increased hepatic echogenicity consistent with fatty infiltration and/or hepatocellular disease. Focal area of fatty sparing appears to be present adjacent to the gallbladder fossa. Electronically Signed   By: TMarcello Moores Register   On: 01/09/2018 11:44    Review of Systems  Constitutional: Negative.   HENT: Negative.   Eyes: Negative.   Respiratory: Negative.   Cardiovascular: Negative.   Gastrointestinal: Positive for abdominal pain, blood in stool and heartburn. Negative for constipation and diarrhea.  Genitourinary: Negative.   Musculoskeletal: Negative.   Skin: Negative.   Neurological: Negative.   Endo/Heme/Allergies: Negative.   Psychiatric/Behavioral: Negative.     Blood pressure (!) 155/100, pulse 64, temperature 98.6 F (37 C), temperature source Oral, resp. rate 15, height '5\' 10"'$  (1.778 m), weight 129.3 kg (285 lb), SpO2 97 %. Physical Exam  Constitutional: He is oriented to person, place, and time. He appears well-developed and  well-nourished. No distress.  HENT:  Head: Normocephalic and atraumatic.  Mouth/Throat: Oropharynx is clear and moist.  Eyes: Right eye exhibits no discharge. Left eye exhibits no discharge. No scleral icterus.  Pupils are equal  Neck: Normal range of motion. Neck supple. No JVD present. No tracheal  deviation present. No thyromegaly present.  Cardiovascular: Normal rate, regular rhythm, normal heart sounds and intact distal pulses.  No murmur heard. Respiratory: Effort normal and breath sounds normal. No respiratory distress. He has no wheezes. He has no rales. He exhibits no tenderness.  GI: Soft. Bowel sounds are normal. He exhibits no distension and no mass. There is tenderness (RUQ). There is no rebound and no guarding.  Right lower quadrant scar from Meckel's diverticulum.  Musculoskeletal: He exhibits no edema or tenderness.  Lymphadenopathy:    He has no cervical adenopathy.  Neurological: He is alert and oriented to person, place, and time. No cranial nerve deficit.  Skin: Skin is warm and dry. No rash noted. He is not diaphoretic. No erythema. No pallor.  Psychiatric: He has a normal mood and affect. His behavior is normal. Judgment and thought content normal.     Assessment/Plan Cholelithiasis/cholecystitis Body mass index is 40.89   Plan: She is being admitted and taken to the operating room for cholecystectomy and intraoperative cholangiogram.  Patient was seen and evaluated by Dr. Windle Guard. the risk and benefits were discussed in detail.  Questions were answered and patient is agreeable to the procedure. Initiating antibiotic therapy and surgery this afternoon. Jahden Schara, PA-C 01/09/2018, 12:27 PM

## 2018-01-09 NOTE — Op Note (Signed)
Operative Note  Steven Webb 37 y.o. male [id number]  01/09/2018  Surgeon: Berna Buehelsea A Brylen Wagar MD  Assistant: Will Marlyne BeardsJennings PA-C  Procedure performed: Laparoscopic Cholecystectomy with intraoperative cholangiogram  Preop diagnosis: cholecystitis Post-op diagnosis/intraop findings: acute on chronic cholecystitis with large stone impacted in the gallbladder neck, negative cholangiogram  Specimens: gallbladder  EBL: minimal  Complications: none  Description of procedure: After obtaining informed consent the patient was brought to the operating room. Prophylactic antibiotics were administered. SCD's were applied. General endotracheal anesthesia was initiated and a formal time-out was performed. The abdomen was prepped and draped in the usual sterile fashion and the abdomen was entered using visiport technique in the left upper quadrant after instilling the site with local. Insufflation to 15mmHg was obtained and gross inspection revealed no evidence of injury from our entry or other intraabdominal abnormalities. Three 5mm trocars were introduced in the supraumbilical, right midclavicular and right anterior axillary lines under direct visualization and following infiltration with local. An 11mm trocar was placed in the epigastrium. the gallbladder was very large and distended. It was decompressed with the Nez[place] aspirator so that it could be grasped. There were extensive omental adhesions with significant edema to the body and infundibulum of the gallbladder which were carefully taken down with cautery and blunt dissection until the infundibulum could be clearly identified. The gallbladder was retracted cephalad and the infundibulum was retracted laterally. A combination of hook electrocautery and blunt dissection was utilized to clear the peritoneum from the neck and cystic duct, circumferentially isolating the cystic artery and cystic duct and lifting the gallbladder from the cystic plate. The  critical view of safety was achieved with the cystic artery, cystic duct, and liver bed visualized between them with no other structures. The artery was clipped with a single clip proximally and distally and divided. A clip was placed on the distal cystic duct and a ductotomy made sharply. The [facility] catheter was introduced through a stab incision in the upper abdomen and then inserted into the cystic duct where it was secured with a clip. A cholangiogram was performed which demonstrated appropriate biliary anatomy with passage of contrast easily into the duodenum with no filling defects on my review. The catheter was removed along with its securing clip and 3 clips were placed on the proximal cystic duct which was then divided sharply. The gallbladder was dissected from the liver plate using electrocautery. Once freed the gallbladder was placed in an endocatch bag and removed intact through the epigastric trocar site which did have to be extended in order to extract the large gallbladder containing large stones. A small amount of bleeding on the liver bed was controlled with cautery. Some bile had been spilled from the gallbladder from the point where the Nezhat aspirator had been inserted. This was aspirated and the right upper quadrant was irrigated copiously until the effluent was clear. Hemostasis was once again confirmed, and reinspection of the abdomen revealed no injuries. Given his history of appendectomy and Meckel's diverticulectomy, the lower abdomen was also inspected and was noted to be free of any adhesions or gross abnormality. The clips were well opposed without any bile leak from the duct or the liver bed. The 11mm trocar site in the epigastrium was closed with two 0 vicryls in the fascia under direct visualization using a PMI device. The abdomen was desufflated and all trocars removed. The skin incisions were closed with running subcuticular monocryl. Benzoin, Steri-Strips and Band-Aids were  applied. The patient was awakened, extubated and  transported to the recovery room in stable condition.   All counts were correct at the completion of the case.

## 2018-01-09 NOTE — Anesthesia Preprocedure Evaluation (Signed)
Anesthesia Evaluation  Patient identified by MRN, date of birth, ID band Patient awake    Reviewed: Allergy & Precautions, NPO status , Patient's Chart, lab work & pertinent test results  Airway Mallampati: II  TM Distance: >3 FB Neck ROM: Full    Dental no notable dental hx.    Pulmonary neg pulmonary ROS,    Pulmonary exam normal breath sounds clear to auscultation       Cardiovascular negative cardio ROS Normal cardiovascular exam Rhythm:Regular Rate:Normal     Neuro/Psych negative neurological ROS  negative psych ROS   GI/Hepatic negative GI ROS, Neg liver ROS,   Endo/Other  Morbid obesity  Renal/GU negative Renal ROS  negative genitourinary   Musculoskeletal negative musculoskeletal ROS (+)   Abdominal   Peds negative pediatric ROS (+)  Hematology negative hematology ROS (+)   Anesthesia Other Findings   Reproductive/Obstetrics negative OB ROS                            Anesthesia Physical Anesthesia Plan  ASA: II  Anesthesia Plan: General   Post-op Pain Management:    Induction: Intravenous  PONV Risk Score and Plan: 2 and Ondansetron, Dexamethasone and Treatment may vary due to age or medical condition  Airway Management Planned: Oral ETT  Additional Equipment:   Intra-op Plan:   Post-operative Plan: Extubation in OR  Informed Consent: I have reviewed the patients History and Physical, chart, labs and discussed the procedure including the risks, benefits and alternatives for the proposed anesthesia with the patient or authorized representative who has indicated his/her understanding and acceptance.   Dental advisory given  Plan Discussed with: CRNA and Surgeon  Anesthesia Plan Comments:         Anesthesia Quick Evaluation  

## 2018-01-09 NOTE — ED Triage Notes (Signed)
Pt presenting with abdominal pain that began around midnight that then progressed to chest pain. Pt reports taking GasX and Tums with no relief.

## 2018-01-09 NOTE — ED Provider Notes (Signed)
[facility]-EMERGENCY DEPT Provider Note   CSN: [id number] Arrival date & time: 01/09/18  0541     History   Chief Complaint Chief Complaint  Patient presents with  . Chest Pain  . Abdominal Pain    HPI Steven Webb is a 37 y.o. male.  HPI Pt was seen at 0715.  Per pt, c/o gradual onset and persistence of constant upper abd "pain" since approximately midnight.  Describes the abd pain as "sharp" with radiation into his anterior chest. States he could not lay on his right side due to increasing pain in the right side of his abd.  Denies N/V, no diarrhea, no fevers, no back pain, no rash, no CP/SOB, no black or blood in stools.      Past Medical History:  Diagnosis Date  . SBO (small bowel obstruction) (HCC) 2003    There are no active problems to display for this patient.   History reviewed. No pertinent surgical history.      Home Medications    Prior to Admission medications   Not on File    Family History No family history on file.  Social History Social History   Tobacco Use  . Smoking status: Not on file  Substance Use Topics  . Alcohol use: Not on file  . Drug use: Not on file     Allergies   Patient has no known allergies.   Review of Systems Review of Systems ROS: Statement: All systems negative except as marked or noted in the HPI; Constitutional: Negative for fever and chills. ; ; Eyes: Negative for eye pain, redness and discharge. ; ; ENMT: Negative for ear pain, hoarseness, nasal congestion, sinus pressure and sore throat. ; ; Cardiovascular: Negative for chest pain, palpitations, diaphoresis, dyspnea and peripheral edema. ; ; Respiratory: Negative for cough, wheezing and stridor. ; ; Gastrointestinal: +abd pain. Negative for nausea, vomiting, diarrhea, blood in stool, hematemesis, jaundice and rectal bleeding. . ; ; Genitourinary: Negative for dysuria, flank pain and hematuria. ; ; Musculoskeletal: Negative for back pain  and neck pain. Negative for swelling and trauma.; ; Skin: Negative for pruritus, rash, abrasions, blisters, bruising and skin lesion.; ; Neuro: Negative for headache, lightheadedness and neck stiffness. Negative for weakness, altered level of consciousness, altered mental status, extremity weakness, paresthesias, involuntary movement, seizure and syncope.       Physical Exam Updated Vital Signs BP (!) 174/103 (BP Location: Left Arm)   Pulse (!) 59   Temp 98.6 F (37 C) (Oral)   Resp 14   Ht 5\' 10"  (1.778 m)   Wt 129.3 kg (285 lb)   SpO2 100%   BMI 40.89 kg/m    BP (!) 166/92   Pulse 66   Temp 98.6 F (37 C) (Oral)   Resp 10   Ht 5\' 10"  (1.778 m)   Wt 129.3 kg (285 lb)   SpO2 97%   BMI 40.89 kg/m    Physical Exam 0720: Physical examination:  Nursing notes reviewed; Vital signs and O2 SAT reviewed;  Constitutional: Well developed, Well nourished, Well hydrated, In no acute distress; Head:  Normocephalic, atraumatic; Eyes: EOMI, PERRL, No scleral icterus; ENMT: Mouth and pharynx normal, Mucous membranes moist; Neck: Supple, Full range of motion, No lymphadenopathy; Cardiovascular: Regular rate and rhythm, No gallop; Respiratory: Breath sounds clear & equal bilaterally, No wheezes.  Speaking full sentences with ease, Normal respiratory effort/excursion; Chest: Nontender, Movement normal; Abdomen: Soft, +upper abd > diffuse tenderness to palp. No rebound  or guarding. Nondistended, Normal bowel sounds; Genitourinary: No CVA tenderness; Extremities: Peripheral pulses normal, No tenderness, No edema, No calf edema or asymmetry.; Neuro: AA&Ox3, Major CN grossly intact.  Speech clear. No gross focal motor or sensory deficits in extremities.; Skin: Color normal, Warm, Dry.   ED Treatments / Results  Labs (all labs ordered are listed, but only abnormal results are displayed)   EKG EKG Interpretation  Date/Time:  Tuesday January 09 2018 05:50:54 EDT Ventricular Rate:  62 PR Interval:      QRS Duration: 95 QT Interval:  402 QTC Calculation: 409 R Axis:   69 Text Interpretation:  Sinus rhythm Normal ECG No previous ECGs available Confirmed by Paula Libra ([id number]) on 01/09/2018 5:56:16 AM   Radiology   Procedures Procedures (including critical care time)  Medications Ordered in ED Medications  morphine 4 MG/ML injection 4 mg (has no administration in time range)  ondansetron (ZOFRAN) injection 4 mg (has no administration in time range)  famotidine (PEPCID) IVPB 20 mg premix (has no administration in time range)  gi cocktail (Maalox,Lidocaine,Donnatal) (30 mLs Oral Given 01/09/18 6045)     Initial Impression / Assessment and Plan / ED Course  I have reviewed the triage vital signs and the nursing notes.  Pertinent labs & imaging results that were available during my care of the patient were reviewed by me and considered in my medical decision making (see chart for details).  MDM Reviewed: previous chart, nursing note and vitals Reviewed previous: labs Interpretation: labs, ECG, x-ray and CT scan   Results for orders placed or performed during the hospital encounter of 01/09/18  CBC with Differential  Result Value Ref Range   WBC 11.7 (H) 4.0 - 10.5 K/uL   RBC 5.28 4.22 - 5.81 MIL/uL   Hemoglobin 16.3 13.0 - 17.0 g/dL   HCT 40.9 81.1 - 91.4 %   MCV 88.3 78.0 - 100.0 fL   MCH 30.9 26.0 - 34.0 pg   MCHC 35.0 30.0 - 36.0 g/dL   RDW 78.2 95.6 - 21.3 %   Platelets 252 150 - 400 K/uL   Neutrophils Relative % 86 %   Neutro Abs 10.0 (H) 1.7 - 7.7 K/uL   Lymphocytes Relative 10 %   Lymphs Abs 1.2 0.7 - 4.0 K/uL   Monocytes Relative 4 %   Monocytes Absolute 0.5 0.1 - 1.0 K/uL   Eosinophils Relative 0 %   Eosinophils Absolute 0.0 0.0 - 0.7 K/uL   Basophils Relative 0 %   Basophils Absolute 0.0 0.0 - 0.1 K/uL  Comprehensive metabolic panel  Result Value Ref Range   Sodium 141 135 - 145 mmol/L   Potassium 3.9 3.5 - 5.1 mmol/L   Chloride 105 98 - 111 mmol/L    CO2 27 22 - 32 mmol/L   Glucose, Bld 119 (H) 70 - 99 mg/dL   BUN 17 6 - 20 mg/dL   Creatinine, Ser 0.86 0.61 - 1.24 mg/dL   Calcium 57.8 8.9 - 46.9 mg/dL   Total Protein 7.8 6.5 - 8.1 g/dL   Albumin 4.5 3.5 - 5.0 g/dL   AST 32 15 - 41 U/L   ALT 70 (H) 0 - 44 U/L   Alkaline Phosphatase 72 38 - 126 U/L   Total Bilirubin 2.2 (H) 0.3 - 1.2 mg/dL   GFR calc non Af Amer >60 >60 mL/min   GFR calc Af Amer >60 >60 mL/min   Anion gap 9 5 - 15  Lipase, blood  Result Value  Ref Range   Lipase 29 11 - 51 U/L  Urinalysis, Routine w reflex microscopic  Result Value Ref Range   Color, Urine YELLOW YELLOW   APPearance CLEAR CLEAR   Specific Gravity, Urine 1.024 1.005 - 1.030   pH 6.0 5.0 - 8.0   Glucose, UA NEGATIVE NEGATIVE mg/dL   Hgb urine dipstick NEGATIVE NEGATIVE   Bilirubin Urine NEGATIVE NEGATIVE   Ketones, ur NEGATIVE NEGATIVE mg/dL   Protein, ur NEGATIVE NEGATIVE mg/dL   Nitrite NEGATIVE NEGATIVE   Leukocytes, UA NEGATIVE NEGATIVE  I-Stat Troponin, ED (not at [facility])  Result Value Ref Range   Troponin i, poc 0.01 0.00 - 0.08 ng/mL   Comment 3          I-stat troponin, ED  Result Value Ref Range   Troponin i, poc 0.02 0.00 - 0.08 ng/mL   Comment 3           Dg Chest 2 View Result Date: 01/09/2018 CLINICAL DATA:  New onset mid chest pain. EXAM: CHEST - 2 VIEW COMPARISON:  Chest x-ray dated January 02, 2014. FINDINGS: Mild cardiomegaly, unchanged. Normal pulmonary vascularity. No focal consolidation, pleural effusion, or pneumothorax. No acute osseous abnormality. IMPRESSION: No active cardiopulmonary disease. Electronically Signed   By: Obie Dredge M.D.   On: 01/09/2018 07:59   Ct Abdomen Pelvis W Contrast Result Date: 01/09/2018 CLINICAL DATA:  Abdominal pain beginning last night, progressing to chest pain. History of colon resection. EXAM: CT ABDOMEN AND PELVIS WITH CONTRAST TECHNIQUE: Multidetector CT imaging of the abdomen and pelvis was performed using the standard protocol  following bolus administration of intravenous contrast. CONTRAST:  ISOVUE-300 IOPAMIDOL (ISOVUE-300) INJECTION 61% COMPARISON:  None. FINDINGS: Lower chest: No significant finding. Hepatobiliary: Diffuse fatty change of the liver without focal lesion. I suspect that there are some pigment stones in the gallbladder neck region. The gallbladder is distended. If there is clinical concern about the possibility of cholecystitis, ultrasound would be suggested. Pancreas: Normal Spleen: Normal Adrenals/Urinary Tract: Adrenal glands are normal. Kidneys are normal. Bladder is normal. Stomach/Bowel: No evidence of bowel obstruction or inflammation. Vascular/Lymphatic: Normal Reproductive: Normal Other: No free fluid or air. Musculoskeletal: Negative IMPRESSION: Steatosis of the liver. Suspicion of pigment stones in the gallbladder neck. Gallbladder somewhat distended. Is there clinical concern regarding cholecystitis? If so, ultrasound would be suggested. Electronically Signed   By: Paulina Fusi M.D.   On: 01/09/2018 10:08   US Abdomen Limited Result Date: 01/09/2018 CLINICAL DATA:  Questionable gallstones. EXAM: ULTRASOUND ABDOMEN LIMITED RIGHT UPPER QUADRANT COMPARISON:  CT 01/09/2018. FINDINGS: Gallbladder: Mild sludge and tiny gallstones noted. Gallbladder wall thickness 2.5 mm. Gallbladder is distended. Negative Murphy sign. Common bile duct: Diameter: 5.7 mm. Liver: Increased hepatic echogenicity consistent fatty infiltration and or hepatocellular disease. Focal area of decreased echogenicity adjacent to the gallbladder fossa consistent with focal fatty sparing. Portal vein is patent on color Doppler imaging with normal direction of blood flow towards the liver. IMPRESSION: 1. Mild sludge with tiny gallstones noted. Gallbladder is distended. Negative Murphy sign. No biliary distention. 2. Increased hepatic echogenicity consistent with fatty infiltration and/or hepatocellular disease. Focal area of fatty sparing  appears to be present adjacent to the gallbladder fossa. Electronically Signed   By: Maisie Fus  Register   On: 01/09/2018 11:44    1235:  LFT's and WBC count elevated. Pt remains afebrile. Pt's CP resolved with GI cocktail. Doubt PE as cause for symptoms with low risk Wells.  Doubt ACS as cause for symptoms  with normal troponin x2 and reassuring EKG after 10+ hours of constant symptoms.  Pt states he did have RUQ tenderness when [place] was performed. Pt's pain returning despite multiple doses of meds. Dx and testing d/w pt and family.  Questions answered.  Verb understanding, agreeable to evaluation by General Surgery/admit.  T/C returned from General Surgery APP Will Marlyne Beards, case discussed, including:  HPI, pertinent PM/SHx, VS/PE, dx testing, ED course and treatment:  Agreeable to come to the ED for evaluation.     Final Clinical Impressions(s) / ED Diagnoses   Final diagnoses:  None    ED Discharge Orders    None       Samuel Jester, DO 01/12/18 1303

## 2018-01-09 NOTE — Anesthesia Postprocedure Evaluation (Signed)
Anesthesia Post Note  Patient: Steven Webb  Procedure(s) Performed: LAPAROSCOPIC CHOLECYSTECTOMY WITH INTRAOPERATIVE CHOLANGIOGRAM (N/A Abdomen)     Patient location during evaluation: PACU Anesthesia Type: General Level of consciousness: awake and alert Pain management: pain level controlled Vital Signs Assessment: post-procedure vital signs reviewed and stable Respiratory status: spontaneous breathing, nonlabored ventilation, respiratory function stable and patient connected to nasal cannula oxygen Cardiovascular status: blood pressure returned to baseline and stable Postop Assessment: no apparent nausea or vomiting Anesthetic complications: no    Last Vitals:  Vitals:   01/09/18 1645 01/09/18 1655  BP: (!) 148/85 (!) 152/86  Pulse: 74 75  Resp: 17 12  Temp:  37.4 C  SpO2: 98% 98%    Last Pain:  Vitals:   01/09/18 1655  TempSrc:   PainSc: 3                  Hattye Siegfried,W. EDMOND

## 2018-01-09 NOTE — Discharge Instructions (Signed)
[facility] ______[facility] °LAPAROSCOPIC SURGERY: POST OP INSTRUCTIONS °Always review your discharge instruction sheet given to you by the facility where your surgery was performed. °IF YOU HAVE DISABILITY OR FAMILY LEAVE FORMS, YOU MUST BRING THEM TO THE OFFICE FOR PROCESSING.   °DO NOT GIVE THEM TO YOUR DOCTOR. ° °1. A prescription for pain medication may be given to you upon discharge.  Take your pain medication as prescribed, if needed.  If narcotic pain medicine is not needed, then you may take acetaminophen (Tylenol) or ibuprofen (Advil) as needed. °2. Take your usually prescribed medications unless otherwise directed. °3. If you need a refill on your pain medication, please contact your pharmacy.  They will contact our office to request authorization. Prescriptions will not be filled after 5pm or on week-ends. °4. You should follow a light diet the first few days after arrival home, such as soup and crackers, etc.  Be sure to include lots of fluids daily. °5. Most patients will experience some swelling and bruising in the area of the incisions.  Ice packs will help.  Swelling and bruising can take several days to resolve.  °6. It is common to experience some constipation if taking pain medication after surgery.  Increasing fluid intake and taking a stool softener (such as Colace) will usually help or prevent this problem from occurring.  A mild laxative (Milk of Magnesia or Miralax) should be taken according to package instructions if there are no bowel movements after 48 hours. °7. Unless discharge instructions indicate otherwise, you may remove your bandages 24-48 hours after surgery, and you may shower at that time.  You may have steri-strips (small skin tapes) in place directly over the incision.  These strips should be left on the skin for 7-10 days.  If your surgeon used skin glue on the incision, you may shower in 24 hours.  The glue will flake off over the next 2-3 weeks.  Any sutures or  staples will be removed at the office during your follow-up visit. °8. ACTIVITIES:  You may resume regular (light) daily activities beginning the next day--such as daily self-care, walking, climbing stairs--gradually increasing activities as tolerated.  You may have sexual intercourse when it is comfortable.  Refrain from any heavy lifting or straining until approved by your doctor. °a. You may drive when you are no longer taking prescription pain medication, you can comfortably wear a seatbelt, and you can safely maneuver your car and apply brakes. °b. RETURN TO WORK:  __1 week________________________________________________________ °9. You should see your doctor in the office for a follow-up appointment approximately 2-3 weeks after your surgery.  Make sure that you call for this appointment within a day or two after you arrive home to insure a convenient appointment time. °10. OTHER INSTRUCTIONS: __________________________________________________________________________________________________________________________ __________________________________________________________________________________________________________________________ °WHEN TO CALL YOUR DOCTOR: °1. Fever over 101.0 °2. Inability to urinate °3. Continued bleeding from incision. °4. Increased pain, redness, or drainage from the incision. °5. Increasing abdominal pain ° °The clinic staff is available to answer your questions during regular business hours.  Please don’t hesitate to call and ask to speak to one of the nurses for clinical concerns.  If you have a medical emergency, go to the nearest emergency room or call 911.  A surgeon from [facility] is always on call at the hospital. °[address], [address], [place], [place]  [address] ? [address] [address], [place], [place]   [address] °[phone] ? [phone] ? FAX [phone] °Web   site: [email or web address] ° °

## 2018-01-09 NOTE — Transfer of Care (Signed)
Immediate Anesthesia Transfer of Care Note  Patient: Steven Webb  Procedure(s) Performed: LAPAROSCOPIC CHOLECYSTECTOMY WITH INTRAOPERATIVE CHOLANGIOGRAM (N/A Abdomen)  Patient Location: PACU  Anesthesia Type:General  Level of Consciousness: awake, alert  and oriented  Airway & Oxygen Therapy: Patient Spontanous Breathing and Patient connected to face mask oxygen  Post-op Assessment: Report given to RN and Post -op Vital signs reviewed and stable  Post vital signs: Reviewed and stable  Last Vitals:  Vitals Value Taken Time  BP 146/87 01/09/2018  4:00 PM  Temp    Pulse 78 01/09/2018  4:02 PM  Resp 17 01/09/2018  4:02 PM  SpO2 99 % 01/09/2018  4:02 PM  Vitals shown include unvalidated device data.  Last Pain:  Vitals:   01/09/18 1312  TempSrc: Oral  PainSc:          Complications: No apparent anesthesia complications

## 2018-01-09 NOTE — Anesthesia Procedure Notes (Signed)
Procedure Name: Intubation Date/Time: 01/09/2018 2:03 PM Performed by: Florene Routeeardon, Kycen Spalla L, CRNA Patient Re-evaluated:Patient Re-evaluated prior to induction Oxygen Delivery Method: Circle system utilized Preoxygenation: Pre-oxygenation with 100% oxygen Induction Type: IV induction Ventilation: Oral airway inserted - appropriate to patient size and Two handed mask ventilation required Laryngoscope Size: Miller and 3 Grade View: Grade II Tube type: Oral Tube size: 8.0 mm Number of attempts: 1 Airway Equipment and Method: Bougie stylet Placement Confirmation: ETT inserted through vocal cords under direct vision,  positive ETCO2 and breath sounds checked- equal and bilateral Secured at: 23 cm Dental Injury: Teeth and Oropharynx as per pre-operative assessment  Difficulty Due To: Difficulty was anticipated, Difficult Airway- due to large tongue, Difficult Airway- due to reduced neck mobility and Difficult Airway- due to limited oral opening

## 2018-01-10 ENCOUNTER — Encounter (HOSPITAL_COMMUNITY): Payer: Self-pay | Admitting: Surgery

## 2018-01-10 LAB — COMPREHENSIVE METABOLIC PANEL
ALT: 89 U/L — AB (ref 0–44)
AST: 47 U/L — ABNORMAL HIGH (ref 15–41)
Albumin: 3.8 g/dL (ref 3.5–5.0)
Alkaline Phosphatase: 58 U/L (ref 38–126)
Anion gap: 9 (ref 5–15)
BUN: 14 mg/dL (ref 6–20)
CALCIUM: 9 mg/dL (ref 8.9–10.3)
CHLORIDE: 105 mmol/L (ref 98–111)
CO2: 25 mmol/L (ref 22–32)
Creatinine, Ser: 1.01 mg/dL (ref 0.61–1.24)
Glucose, Bld: 135 mg/dL — ABNORMAL HIGH (ref 70–99)
Potassium: 4.4 mmol/L (ref 3.5–5.1)
Sodium: 139 mmol/L (ref 135–145)
TOTAL PROTEIN: 6.8 g/dL (ref 6.5–8.1)
Total Bilirubin: 2 mg/dL — ABNORMAL HIGH (ref 0.3–1.2)

## 2018-01-10 LAB — HIV ANTIBODY (ROUTINE TESTING W REFLEX): HIV Screen 4th Generation wRfx: NONREACTIVE

## 2018-01-10 MED ORDER — ACETAMINOPHEN 325 MG PO TABS: ORAL_TABLET | ORAL | Status: DC

## 2018-01-10 MED ORDER — ACETAMINOPHEN 325 MG PO TABS: ORAL_TABLET | ORAL | Status: AC

## 2018-01-10 MED ORDER — IBUPROFEN 200 MG PO TABS: ORAL_TABLET | ORAL | Status: AC

## 2018-01-10 NOTE — Progress Notes (Signed)
Discharge paperwork discussed with pt and wife at the bedside.  No questions and they demonstrated understanding. Pt was escorted by wheelchair in stable condition to main lobby.

## 2018-01-10 NOTE — Discharge Summary (Signed)
Physician Discharge Summary  Patient ID: Steven QuanMatthew Reeves MRN: [id number] DOB/AGE: 36/07/1980 36 y.o.  Admit date: 01/09/2018 Discharge date: 01/10/2018  Admission Diagnoses:   Cholelithiasis/cholecystitis Body mass index is 40.89    Discharge Diagnoses:  Acute on chronic cholecystitis with large stone impacted in the gallbladder neck BMI 40.8     Active Problems:   Cholelithiasis with acute cholecystitis   PROCEDURES: Laparoscopic Cholecystectomywith intraoperative cholangiogram, 01/09/18, Dr. Lurline Idolhesea Connor    Hospital Course:  Patient is a 37 year old male who started abdominal pain last evening went up to his chest.  He took Gas-X and Tums without relief.  He presented to the ED this a.m. with assistant constant upper abdominal pain described as sharp with radiation to his chest.  Complains of pain in his right side and could not touch it due to increased abdominal pain.  No nausea vomiting diarrhea no fevers no back pain no rashes no chest pain or shortness of breath.  Work-up in the ED shows his blood pressure is elevated at 174/103.  It remains high the last one at 10 AM was 155/100.  Respiratory rate and pulses are normal.  Sats are 92% on room air.  His BMI is 40.89. Labs shows electrolytes are normal, his glucose is 119.  His lipase is 32.  His bilirubin is elevated at 2.2.  AST is 32, ALT is 70.  White count is 11.7, hemoglobin 16.3, hematocrit 46.6, platelets are 252,000.  His left shift with 86 neutrophils.  Urinalysis is negative. Chest x-ray is normal there is mild cardiomegaly with no acute active cardiopulmonary disease.  CT of the abdomen shows steatosis of the liver possible stones in the gallbladder neck.  The gallbladder somewhat distended there is a clinical concern regarding cholecystitis.  An abdominal ultrasound was obtained at that time. This shows mild sludge and tiny gallstones noted.  Gallbladder wall thickness is 2.5 mm.  The bladder was distended.  Common  bile duct was 5.7 mm.  Liver showed increased hepatic echogenicity consistent with fatty liver infiltration or hepatocellular disease.  Portal vein was patent with normal blood flow. He was seen and admitted to the hospital by Dr. Fredricka Bonineonnor and taken to the OR later that day.  He had a very large and sick gallbladder.  He underwent the procedure noted above and returned to the floor.  He did well post op and was ready for discharge the following day.    CBC Latest Ref Rng & Units 01/09/2018  WBC 4.0 - 10.5 K/uL 11.7(H)  Hemoglobin 13.0 - 17.0 g/dL 08.616.3  Hematocrit 57.839.0 - 52.0 % 46.6  Platelets 150 - 400 K/uL 252   CMP Latest Ref Rng & Units 01/10/2018 01/09/2018  Glucose 70 - 99 mg/dL 469(G135(H) 295(M119(H)  BUN 6 - 20 mg/dL 14 17  Creatinine 8.410.61 - 1.24 mg/dL 3.241.01 4.011.03  Sodium 027135 - 145 mmol/L 139 141  Potassium 3.5 - 5.1 mmol/L 4.4 3.9  Chloride 98 - 111 mmol/L 105 105  CO2 22 - 32 mmol/L 25 27  Calcium 8.9 - 10.3 mg/dL 9.0 25.310.0  Total Protein 6.5 - 8.1 g/dL 6.8 7.8  Total Bilirubin 0.3 - 1.2 mg/dL 2.0(H) 2.2(H)  Alkaline Phos 38 - 126 U/L 58 72  AST 15 - 41 U/L 47(H) 32  ALT 0 - 44 U/L 89(H) 70(H)   Condition on DC:  Improved  Disposition: Discharge disposition: 01-Home or Self Care        Allergies as of 01/10/2018   No Known  Allergies     Medication List    TAKE these medications   acetaminophen 325 MG tablet Commonly known as:  TYLENOL He can take 2 tablets every 4 hours as needed for pain.  You can alternate this with the plain Tylenol or ibuprofen.  This medication contains Tylenol.  Do not take more than 4000 mg of Tylenol per day.  You need to count this along with the plain Hydrocodone/Tylenol to calculate your daily totals.  You can buy this over-the-counter at any drugstore.   docusate sodium 100 MG capsule Commonly known as:  COLACE Take 1 capsule (100 mg total) by mouth 2 (two) times daily.   HYDROcodone-acetaminophen 5-325 MG tablet Commonly known as:   NORCO/VICODIN Take 1 tablet by mouth every 6 (six) hours as needed for moderate pain.   ibuprofen 200 MG tablet Commonly known as:  ADVIL,MOTRIN You can take 2 to 3 tablets every 6 hours as needed for pain.  Can alternate this with plain Tylenol, or the Tylenol/hydrocodone combination.  You can buy this over-the-counter at any drugstore without a prescription.   MULTIVITAMIN ADULT PO Take 1 tablet by mouth daily.      Follow-up Information    [facility] Follow up on 01/23/2018.   Specialty:  General Surgery Why:  Your appointment is at 11:30 AM.  Be at the office 30 minutes early for check-in.  Bring photo ID and insurance information. Contact information: [address] [place] [address] [phone]       Iva Boop, MD Follow up.   Specialty:  Family Medicine Why:  call and let them know you had surgery and follow up for medical issues. Contact information: [address] [place] [address] [phone]           Signed: Sherrie George 01/10/2018, 3:36 PM

## 2018-01-10 NOTE — Plan of Care (Signed)
Pt alert and oriented, resting with wife at bedside. Pain well controlled with PO pain meds.  To d/c home per Md order, tolerating diet. RN will monitor.

## 2018-01-10 NOTE — Progress Notes (Signed)
1 Day Post-Op    CC: Abdominal pain  Subjective: He looks good this a.m.'s had full liquids has had a soft diet yet.  Port sites all look good.  We discussed discharge.  He should be ready to go later after eating a regular diet.  Prescriptions were called in electronically last evening.  Objective: Vital signs in last 24 hours: Temp:  [97.7 F (36.5 C)-99.3 F (37.4 C)] 97.7 F (36.5 C) (06/26 0558) Pulse Rate:  [63-85] 63 (06/26 0558) Resp:  [10-18] 18 (06/26 0558) BP: (146-166)/(81-100) 150/81 (06/26 0558) SpO2:  [95 %-99 %] 97 % (06/26 0558) Last BM Date: 01/09/18 600 Po 2100 IV 1300 urine Afebrile, VSS Labs OK  Intake/Output from previous day: 06/25 0701 - 06/26 0700 In: 2771.7 [P.O.:600; I.V.:2071.7; IV Piggyback:100] Out: 1400 [Urine:1300; Blood:100] Intake/Output this shift: No intake/output data recorded.  General appearance: alert, cooperative and no distress Resp: clear to auscultation bilaterally GI: Port sites all look good.  He is tolerating diet.  Overall doing well.  Lab Results:  Recent Labs    01/09/18 0828  WBC 11.7*  HGB 16.3  HCT 46.6  PLT 252    BMET Recent Labs    01/09/18 0828 01/10/18 0418  NA 141 139  K 3.9 4.4  CL 105 105  CO2 27 25  GLUCOSE 119* 135*  BUN 17 14  CREATININE 1.03 1.01  CALCIUM 10.0 9.0   PT/INR No results for input(s): LABPROT, INR in the last 72 hours.  Recent Labs  Lab 01/09/18 0828 01/10/18 0418  AST 32 47*  ALT 70* 89*  ALKPHOS 72 58  BILITOT 2.2* 2.0*  PROT 7.8 6.8  ALBUMIN 4.5 3.8     Lipase     Component Value Date/Time   LIPASE 29 01/09/2018 0828     Medications: . acetaminophen  1,000 mg Oral Q6H  . famotidine  20 mg Oral QHS  . heparin injection (subcutaneous)  5,000 Units Subcutaneous Q8H   . sodium chloride 100 mL/hr at 01/10/18 0340   Antibiotics Given (last 72 hours)    Date/Time Action Medication Dose   01/09/18 1408 New Bag/Given   cefoTEtan (CEFOTAN) 2 g in sodium  chloride 0.9 % 100 mL IVPB 2 g     Assessment/Plan   Acute on chronic cholecystitis with large stone impacted in the gallbladder neck, negative cholangiogram Laparoscopic Cholecystectomy with intraoperative cholangiogram, 01/09/18, Dr. Olena Materhesea Connor  FEN:  IV fluids/soft diet ID:  Pre op - Cefotetan DVT:  SCD Follow up:  7/9 @ 11:30 AM  - [facility]  Plan: He can go home after tolerating soft diet.  He can have Tylenol, ibuprofen, as primary pain medications.  Hydrocodone was called in last evening.       LOS: 0 days    Shonta Phillis 01/10/2018 [phone]

## 2018-10-05 DIAGNOSIS — J069 Acute upper respiratory infection, unspecified: Secondary | ICD-10-CM | POA: Diagnosis not present

## 2020-01-16 IMAGING — CR DG CHEST 2V
2 series · 2 of 2 positions shown · non-contrast
Comparison: Chest x-ray dated January 02, 2014.

CLINICAL DATA: New onset mid chest pain.

EXAM:
CHEST - 2 VIEW

[w chest pa]
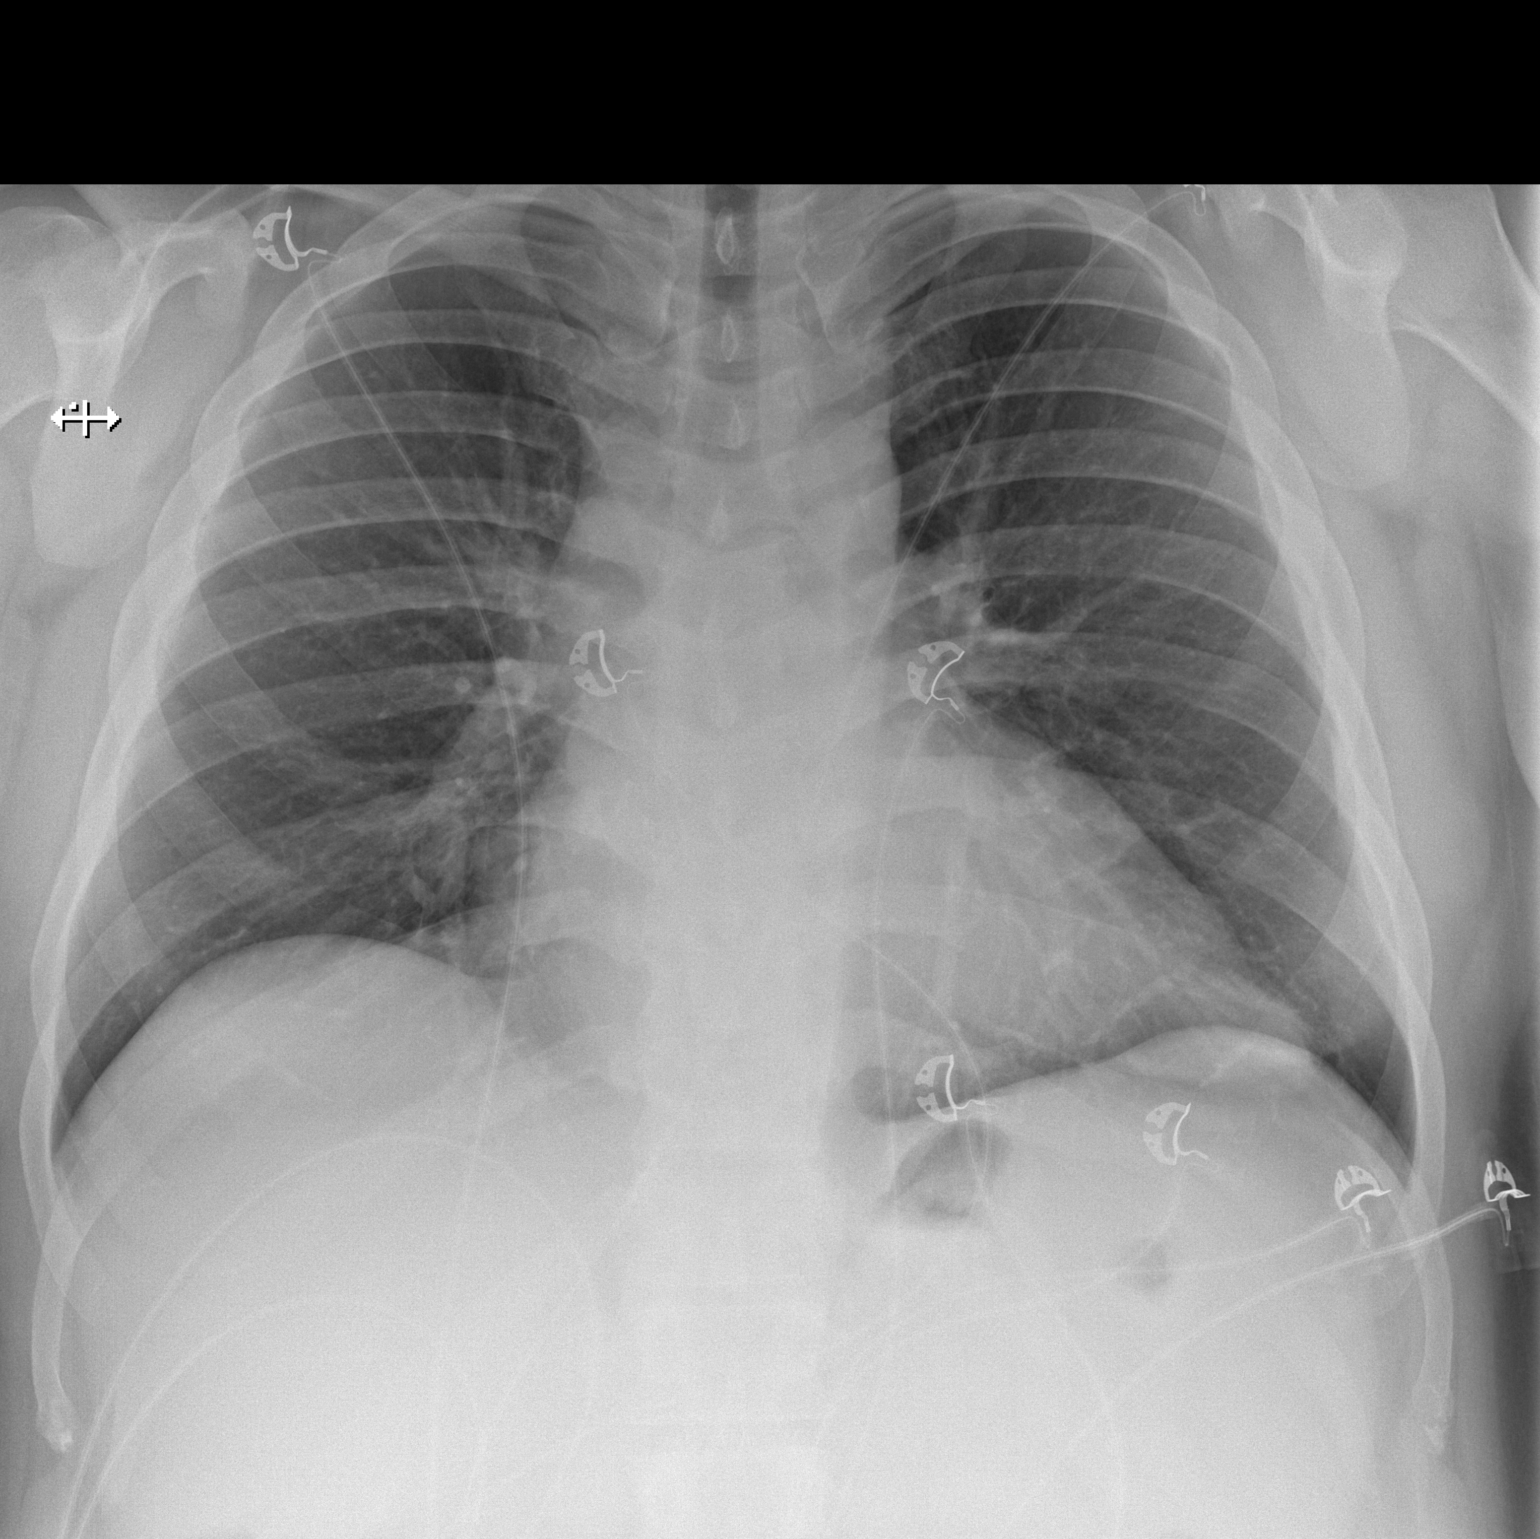

[w chest lat]
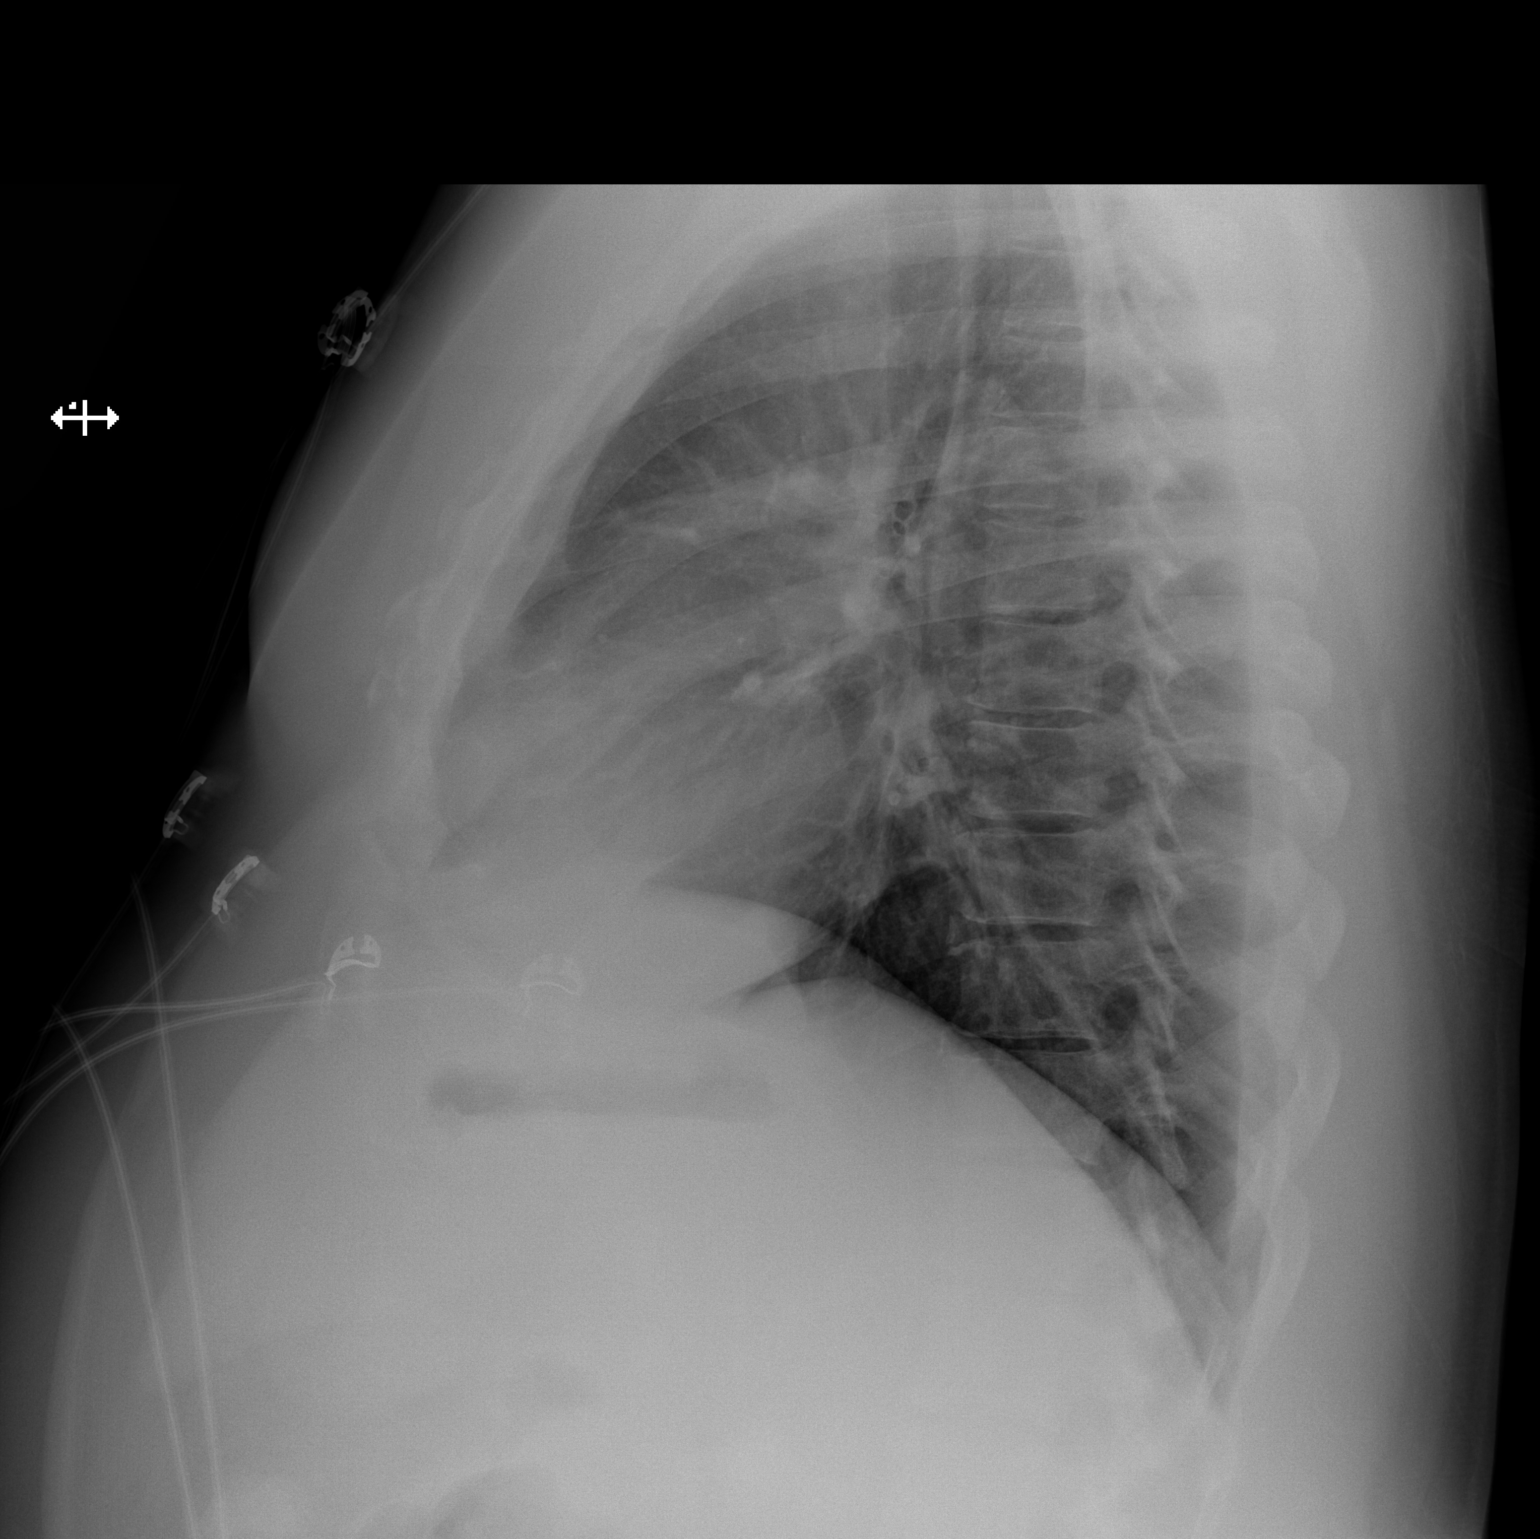

[2 of 2 positions shown; findings below may reference images not displayed]

FINDINGS: Mild cardiomegaly, unchanged. Normal pulmonary vascularity. No focal
consolidation, pleural effusion, or pneumothorax. No acute osseous
abnormality.
IMPRESSION: No active cardiopulmonary disease.

## 2020-01-16 IMAGING — CT CT ABD-PELV W/ CM
2 of 4 series · 17 of 46 positions shown, 19 images · IV contrast (ISOVUE)
Comparison: None.

CLINICAL DATA: Abdominal pain beginning last night, progressing to
chest pain. History of colon resection.

EXAM:
CT ABDOMEN AND PELVIS WITH CONTRAST
TECHNIQUE: Multidetector CT imaging of the abdomen and pelvis was performed
using the standard protocol following bolus administration of
intravenous contrast.
CONTRAST:  100mL [id number] IOPAMIDOL ([id number]) INJECTION 61%

[Series 2: axial st · axial · 0.95mm/px · z∈[-444,+20]mm · 14 of 107 slices shown, 16 images]
[im 7/107  soft-tissue]
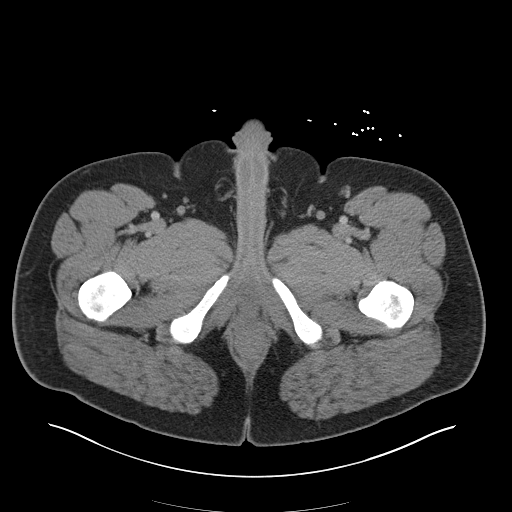
[im 7/107  bone]
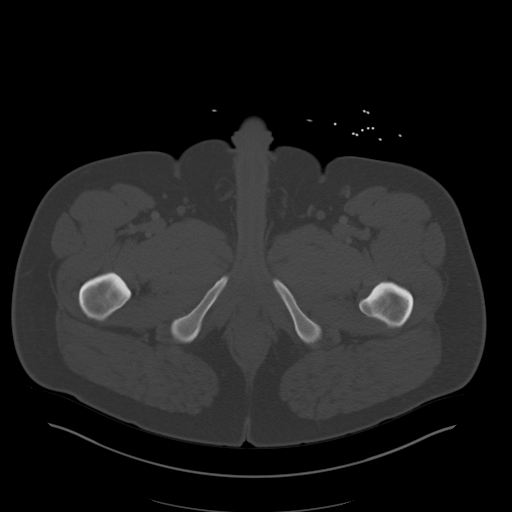
[im 13/107  soft-tissue]
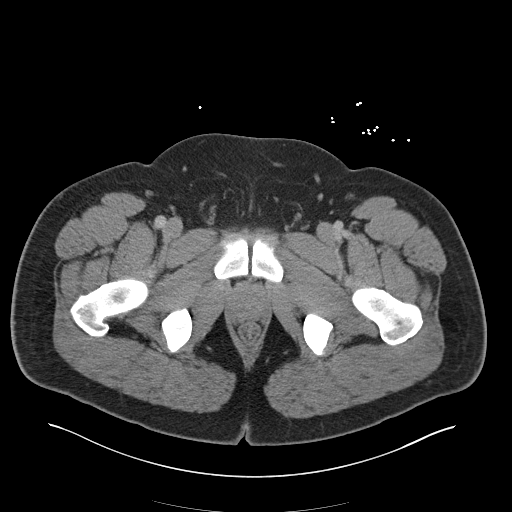
[im 19/107  soft-tissue]
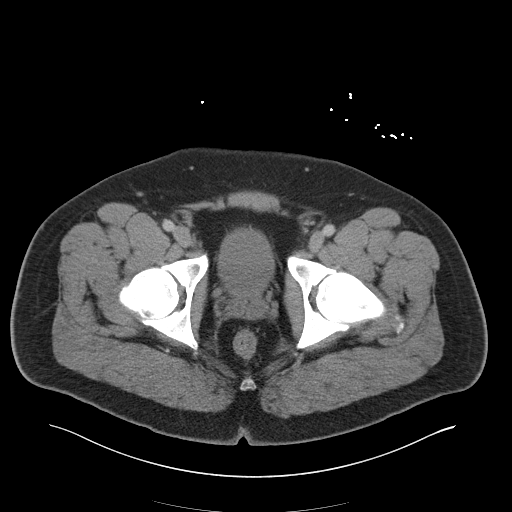
[im 32/107  soft-tissue]
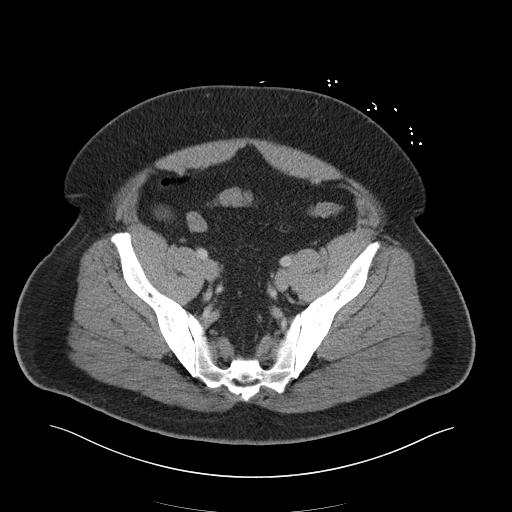
[im 38/107  soft-tissue]
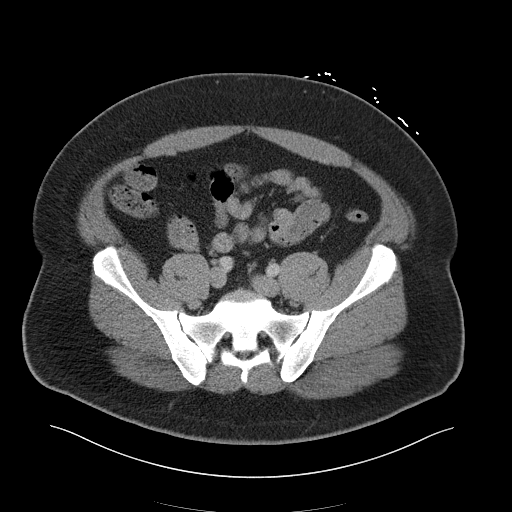
[im 44/107  soft-tissue]
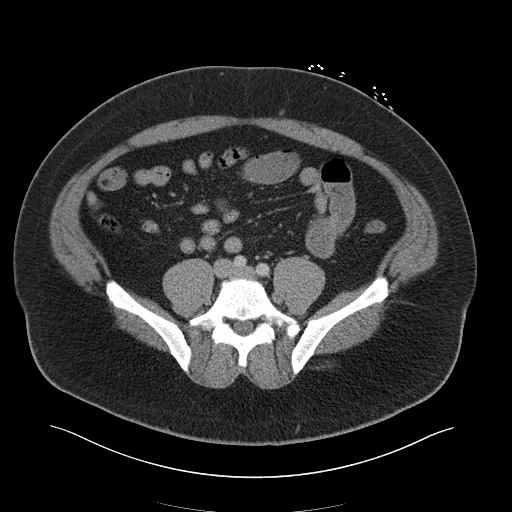
[im 50/107  soft-tissue]
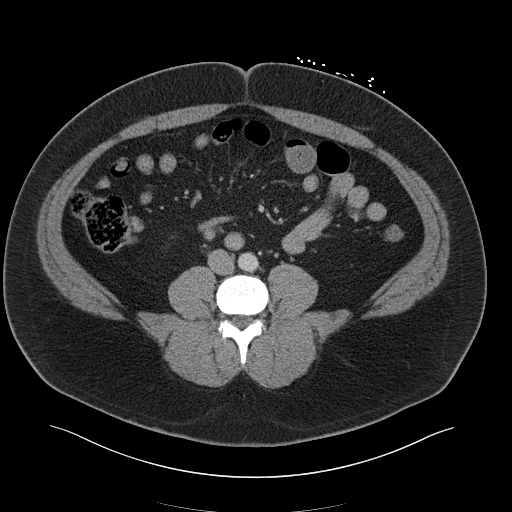
[im 57/107  soft-tissue]
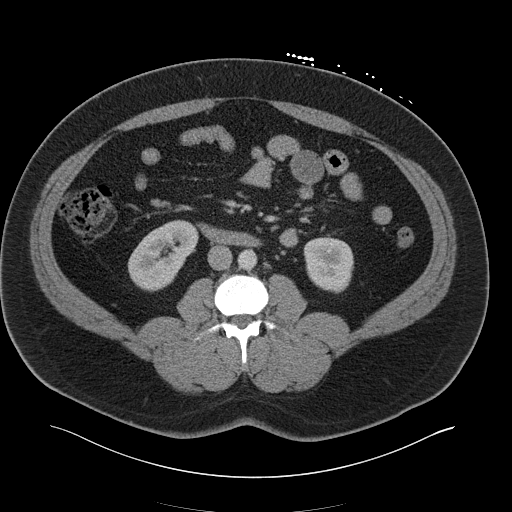
[im 63/107  soft-tissue]
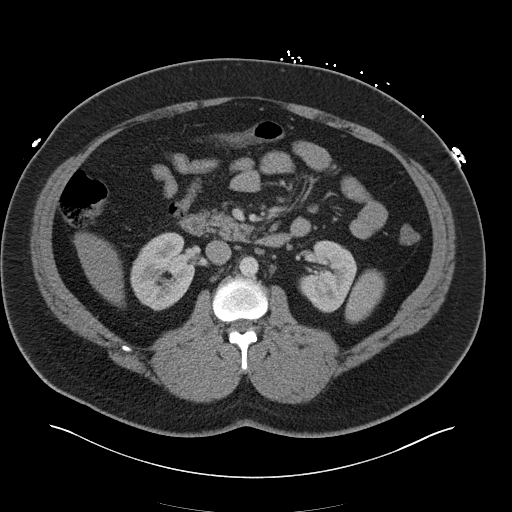
[im 63/107  bone]
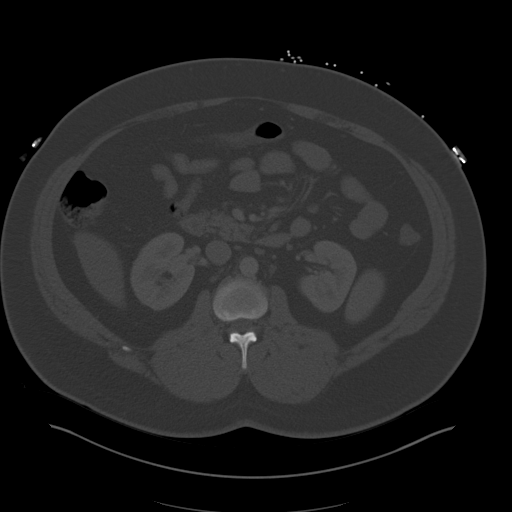
[im 69/107  soft-tissue]
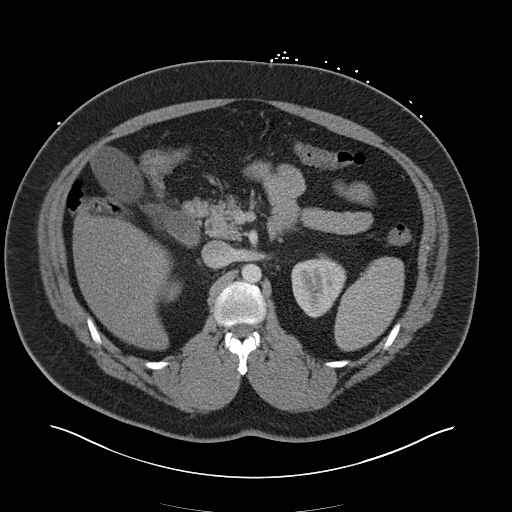
[im 82/107  soft-tissue]
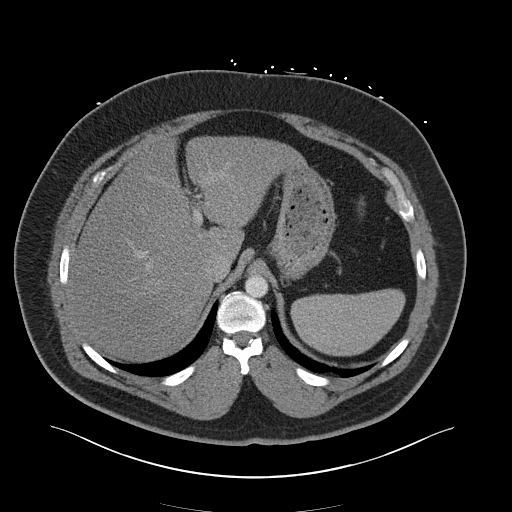
[im 88/107  soft-tissue]
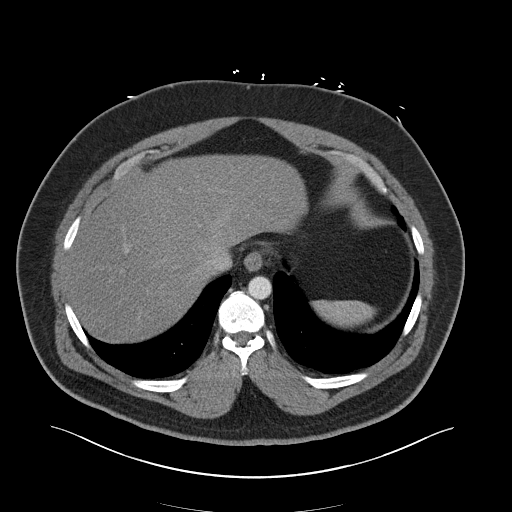
[im 94/107  soft-tissue]
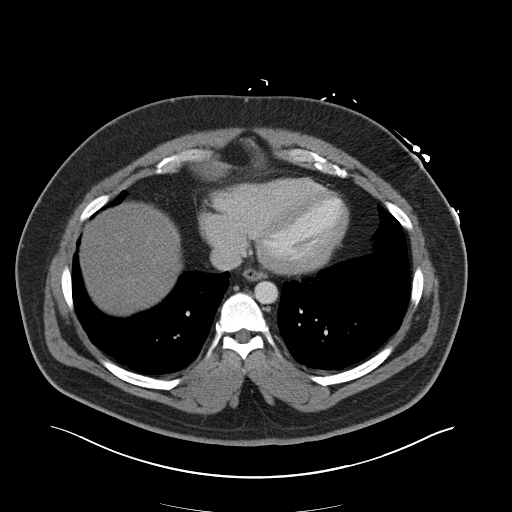
[im 100/107  soft-tissue]
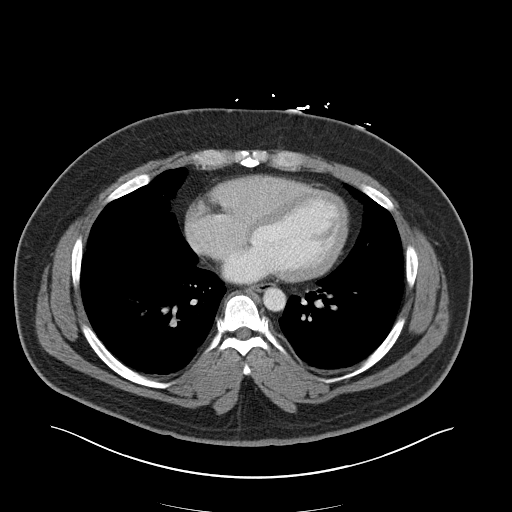

[Series 4: coronal st · coronal · 0.95mm/px · 3 of 130 slices shown]
[im 44/130  soft-tissue]
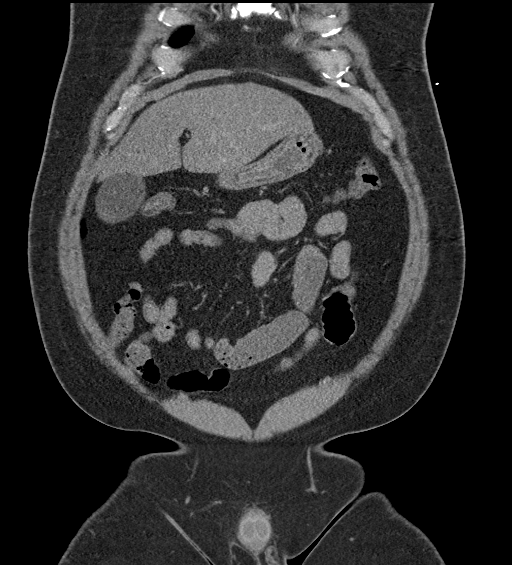
[im 58/130  soft-tissue]
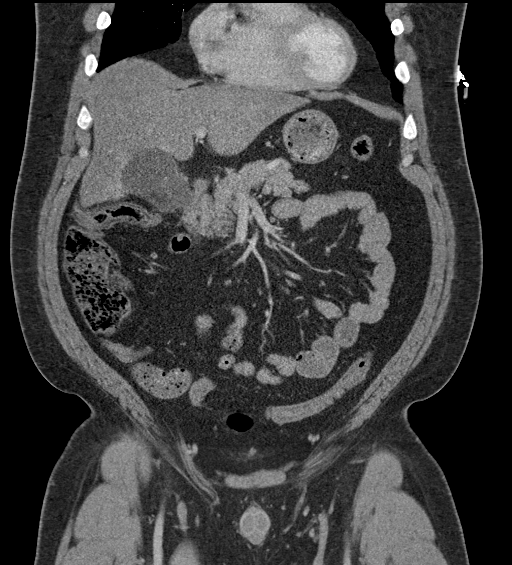
[im 72/130  soft-tissue]
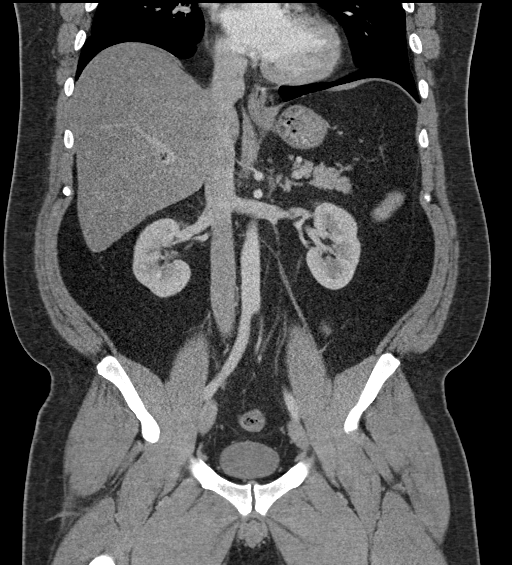

[17 of 46 positions shown; findings below may reference images not displayed]

FINDINGS: Lower chest: No significant finding.

Hepatobiliary: Diffuse fatty change of the liver without focal
lesion. I suspect that there are some pigment stones in the
gallbladder neck region. The gallbladder is distended. If there is
clinical concern about the possibility of cholecystitis, ultrasound
would be suggested.

Pancreas: Normal

Spleen: Normal

Adrenals/Urinary Tract: Adrenal glands are normal. Kidneys are
normal. Bladder is normal.

Stomach/Bowel: No evidence of bowel obstruction or inflammation.

Vascular/Lymphatic: Normal

Reproductive: Normal

Other: No free fluid or air.

Musculoskeletal: Negative
IMPRESSION: Steatosis of the liver. Suspicion of pigment stones in the
gallbladder neck. Gallbladder somewhat distended. Is there clinical
concern regarding cholecystitis? If so, ultrasound would be
suggested.

## 2020-01-16 IMAGING — RF DG CHOLANGIOGRAM OPERATIVE
1 series · 4 of 4 positions shown · non-contrast
Comparison: Right upper quadrant abdominal ultrasound - 01/09/2018;
CT abdomen and pelvis - 01/09/2018

CLINICAL DATA: Intraoperative cholangiogram during laparoscopic
cholecystectomy.

EXAM:
INTRAOPERATIVE CHOLANGIOGRAM
FLUOROSCOPY TIME:  12 seconds

[Series 1: run · 4 of 62 frames shown]
[frame 10/62]
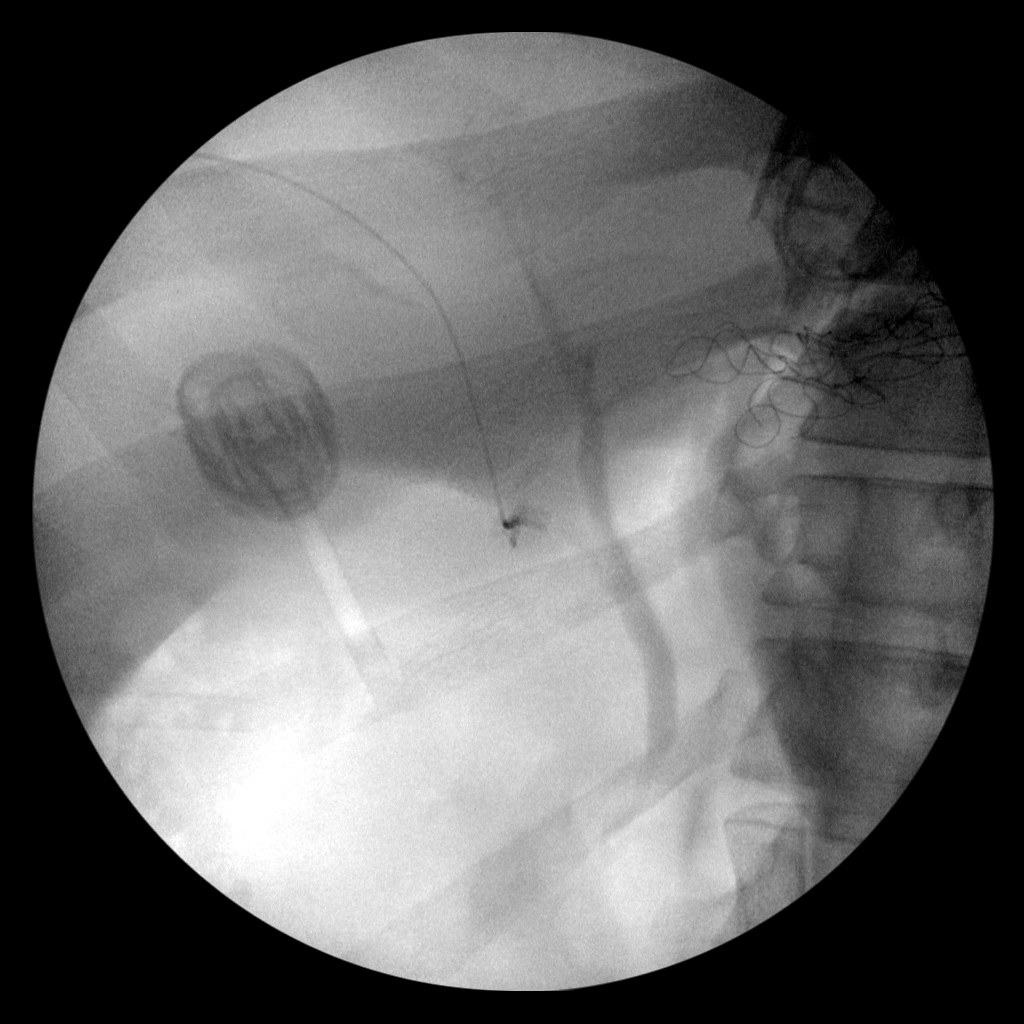
[frame 32/62]
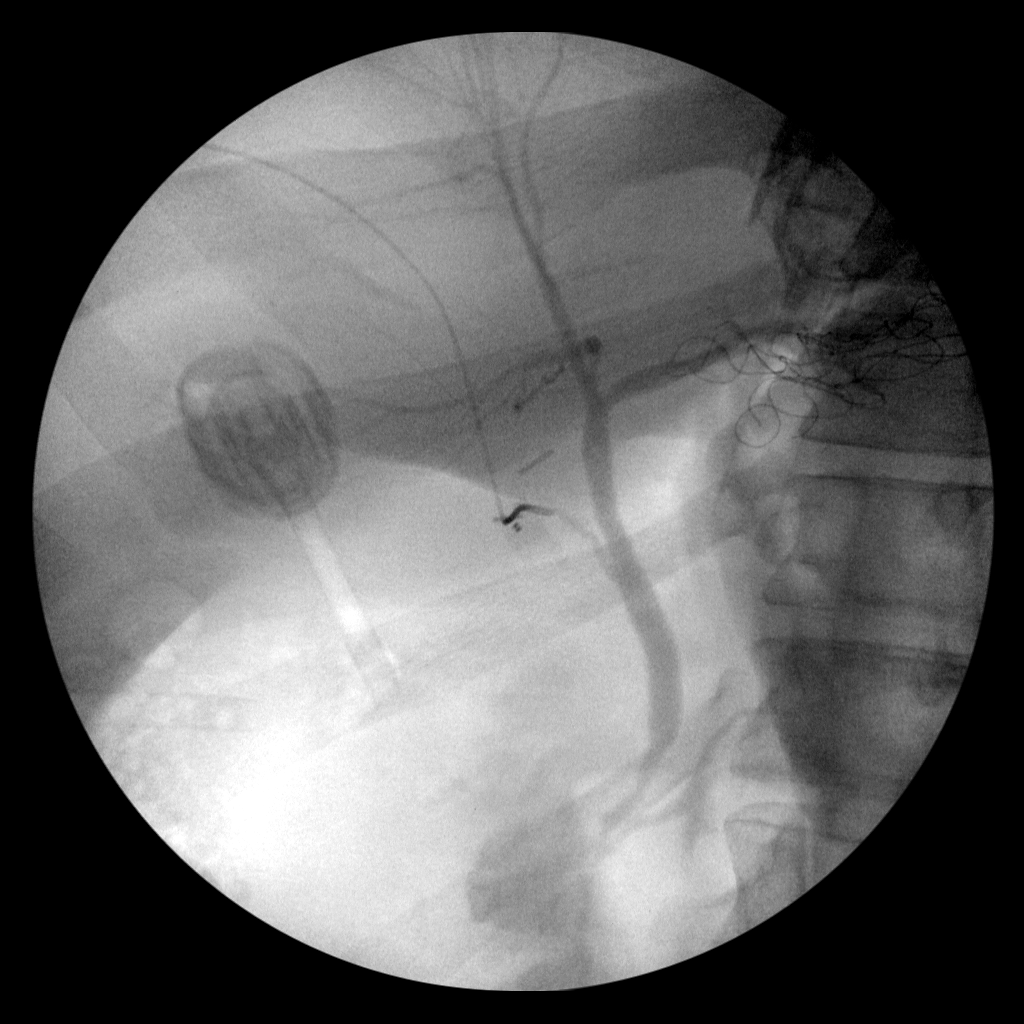
[frame 42/62]
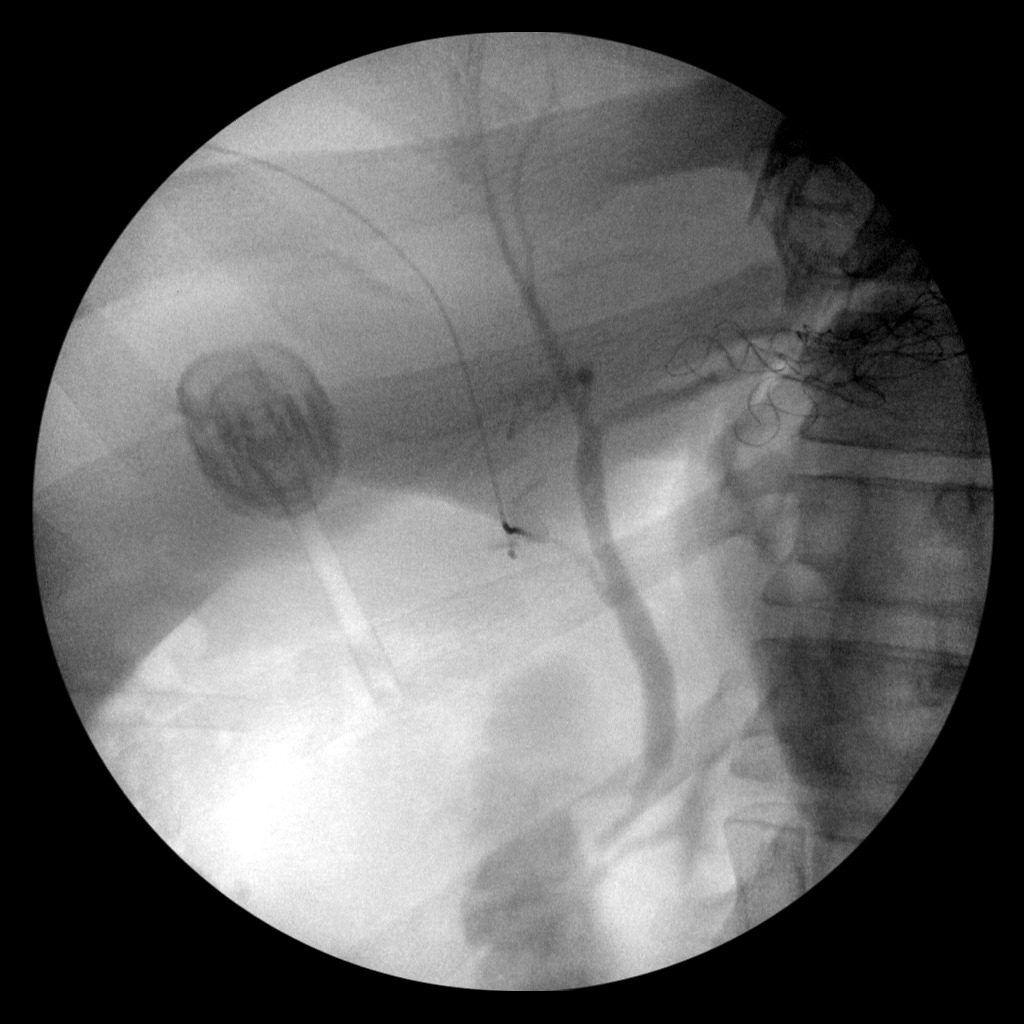
[frame 53/62]
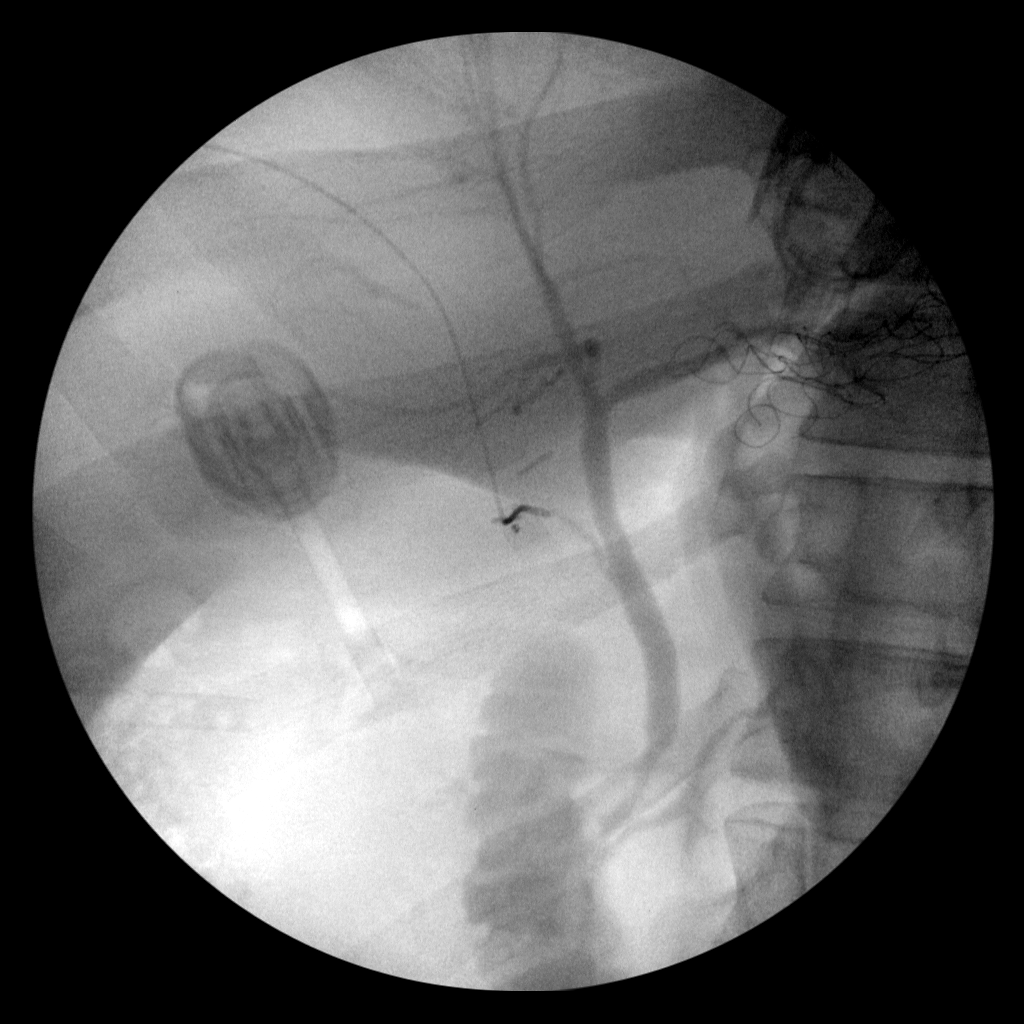

[4 of 4 positions shown; findings below may reference images not displayed]

FINDINGS: Intraoperative cholangiographic images of the right upper abdominal
quadrant during laparoscopic cholecystectomy are provided for
review.

Surgical clips overlie the expected location of the gallbladder
fossa.

Contrast injection demonstrates selective cannulation of the central
aspect of the cystic duct.

There is passage of contrast through the central aspect of the
cystic duct with filling of a non dilated common bile duct. There is
passage of contrast though the CBD and into the descending portion
of the duodenum.

There is minimal reflux of injected contrast into the common hepatic
duct and central aspect of the non dilated intrahepatic biliary
system. There is minimal opacification of the central aspect the
pancreatic duct which appears nondilated.

There are no discrete filling defects within the opacified portions
of the biliary system to suggest the presence of
choledocholithiasis.
IMPRESSION: No evidence of choledocholithiasis.

## 2020-04-01 DIAGNOSIS — R21 Rash and other nonspecific skin eruption: Secondary | ICD-10-CM | POA: Diagnosis not present

## 2020-08-19 DIAGNOSIS — J029 Acute pharyngitis, unspecified: Secondary | ICD-10-CM | POA: Diagnosis not present

## 2020-08-19 DIAGNOSIS — U071 COVID-19: Secondary | ICD-10-CM | POA: Diagnosis not present

## 2020-09-03 DIAGNOSIS — H5213 Myopia, bilateral: Secondary | ICD-10-CM | POA: Diagnosis not present

## 2022-02-08 DIAGNOSIS — R69 Illness, unspecified: Secondary | ICD-10-CM | POA: Diagnosis not present

## 2022-04-27 DIAGNOSIS — R69 Illness, unspecified: Secondary | ICD-10-CM | POA: Diagnosis not present
